# Patient Record
Sex: Female | Born: 1965 | Race: Black or African American | Hispanic: No | Marital: Married | Smoking: Never smoker
Health system: Southern US, Community
[De-identification: ages and names within clinical notes are randomized; demographics above are authoritative.]

## PROBLEM LIST (undated history)

## (undated) DIAGNOSIS — E119 Type 2 diabetes mellitus without complications: Secondary | ICD-10-CM

## (undated) DIAGNOSIS — I10 Essential (primary) hypertension: Secondary | ICD-10-CM

## (undated) HISTORY — PX: ABDOMINAL HYSTERECTOMY: SHX81

---

## 2010-02-22 ENCOUNTER — Ambulatory Visit: Payer: Self-pay | Admitting: Unknown Physician Specialty

## 2010-02-28 ENCOUNTER — Inpatient Hospital Stay: Payer: Self-pay

## 2010-07-25 ENCOUNTER — Ambulatory Visit: Payer: Self-pay | Admitting: Unknown Physician Specialty

## 2016-07-14 ENCOUNTER — Ambulatory Visit: Payer: 59 | Admitting: Family Medicine

## 2016-07-14 ENCOUNTER — Encounter: Payer: Self-pay | Admitting: Family Medicine

## 2016-07-14 ENCOUNTER — Ambulatory Visit (INDEPENDENT_AMBULATORY_CARE_PROVIDER_SITE_OTHER): Payer: 59 | Admitting: Family Medicine

## 2016-07-14 VITALS — BP 128/80 | HR 78 | Ht 65.0 in | Wt 172.0 lb

## 2016-07-14 DIAGNOSIS — R7302 Impaired glucose tolerance (oral): Secondary | ICD-10-CM

## 2016-07-14 DIAGNOSIS — R030 Elevated blood-pressure reading, without diagnosis of hypertension: Secondary | ICD-10-CM | POA: Diagnosis not present

## 2016-07-14 DIAGNOSIS — Z7189 Other specified counseling: Secondary | ICD-10-CM | POA: Diagnosis not present

## 2016-07-14 DIAGNOSIS — R5381 Other malaise: Secondary | ICD-10-CM

## 2016-07-14 DIAGNOSIS — R5383 Other fatigue: Secondary | ICD-10-CM

## 2016-07-14 DIAGNOSIS — Z7689 Persons encountering health services in other specified circumstances: Secondary | ICD-10-CM

## 2016-07-14 NOTE — Patient Instructions (Signed)
High Blood Pressure: Low-Sodium Diet   Many people find that cutting down on sodium lowers their blood pressure. A low-sodium diet limits the amount of sodium in your diet to no more than 2300 milligrams a day. One teaspoon of salt has about 2300 milligrams of sodium.   Our taste for salt is mainly a habit. When you gradually lower the amount of salt in your diet, your taste begins to change. After a while, food begins to taste better without salt than it did with it.   Dietary Recommendations   Table salt added to foods is a common source of sodium in the diet. By not adding salt to foods, you can reduce the amount of sodium in your diet. But sodium is also found in canned and prepared foods, even if they don't taste salty. Learn which foods to avoid by reading labels to find out how much sodium is in the foods. You can reduce the amount of sodium in your diet by following these guidelines:   Read labels carefully. Look for any form of sodium or salt, such as sodium benzoate or sodium citrate. Choose foods that have less salt.  Add very little or no salt to food that you prepare.  Check the sodium content when you use baking powder, baking soda, and monosodium glutamate (MSG).  Do not add salt to food at the table.  Fast foods are very high in salt, as are many other restaurant foods. When you eat at a restaurant, try steamed fish and vegetables or fresh salads. Avoid soups.  Avoid eating the following foods:  ketchup, prepared mustard, pickles, and olives  soy sauce, steak or barbecue sauce, chili sauce, or Worcestershire sauce  bouillon cubes  commercially prepared or cured meats or fish (for example, bacon, luncheon meats, and canned sardines)  canned vegetables, soups, and other packaged convenience foods  salty cheeses and buttermilk  salted nuts and peanut butter  self-rising flour and biscuit mixes  salted crackers, chips, popcorn, and pretzels  commercial salad dressings  instant  cooked cereals.    Many of these foods are now available in unsalted or low-sodium versions. Read all labels carefully.  If your diet must be restricted to much lower amounts of sodium, talk to your health care provider and a registered dietitian for help in planning your meals. It is important to keep your meals nutritionally balanced and tasty. It can be hard to follow a restricted-salt diet if the food doesn't taste good, but there are many healthy ways to add taste without adding salt or fat.   Use of Salt Substitutes   Ask your health care provider about using salt substitutes. Most salt substitutes contain potassium for flavor. If you are taking certain medications, you may need to be careful about the amount of potassium in your diet.        Substitutions and Hints   Season foods with herbs and spices. Use onions, garlic, parsley, lemon and lime juice and rind, dill weed, basil, tarragon, marjoram, thyme, curry powder, turmeric, cumin, paprika, vinegar, or wine to enhance the flavor and aroma of foods. Mushrooms, celery, red pepper, yellow pepper, green pepper, and dried fruits also enhance specific dishes.  Eat fresh foods (instead of canned or packaged foods) as much as possible. Also, plain frozen fruits and vegetables usually do not have added salt.  Add a pinch of sugar or a squeeze of lemon juice to bring out the flavor in fresh vegetables.  If you must   use canned products, use the low-sodium types (except for fruit). Rinse canned vegetables with tap water before cooking.  Substitute unsalted, polyunsaturated margarine for regular margarine or butter.  Eat low-sodium cheeses. Many are available now, some with herbs and spices that are very tasty, and many are also low-fat.  Drink low-sodium juices.  Make unsalted or lightly salted soup stocks and keep them in the freezer to use as substitutes for canned broth and bouillon. Use these stocks to enhance vegetables.  Substitute  wines and vinegars (especially the flavored vinegars) for salt to enhance flavors.  Eat tuna and salmon and rinse first with running water.  Use herbs such as bay leaf, curry, turmeric, cumin, cilantro, dill, marjoram, paprika, pepper, tarragon, thyme, sage, onions, or garlic to season chicken, beef, or fish.  Cook rice in homemade broth with mushrooms and scallions or shallots.  Help Yourself Become Healthier   Check food labels for sodium and fat content.  Read nutrition information available at your [employer], from the [employer], and through nutrition programs and health fairs. Ask your health care provider for printed information on nutrition, diet, and health.  Contact a dietitian for information.  Look for some of the excellent low-sodium cookbooks available in most bookstores.  Take time to plan and enjoy your meals. You will be pleasantly surprised at how fast you learn new food preparations, how lowering your sodium intake lowers your blood pressure, and how good food can be. Diabetes Mellitus and Food It is important for you to manage your blood sugar (glucose) level. Your blood glucose level can be greatly affected by what you eat. Eating healthier foods in the appropriate amounts throughout the day at about the same time each day will help you control your blood glucose level. It can also help slow or prevent worsening of your diabetes mellitus. Healthy eating may even help you improve the level of your blood pressure and reach or maintain a healthy weight.  General recommendations for healthful eating and cooking habits include:  Eating meals and snacks regularly. Avoid going long periods of time without eating to lose weight.  Eating a diet that consists mainly of plant-based foods, such as fruits, vegetables, nuts, legumes, and whole grains.  Using low-heat cooking methods, such as baking, instead of high-heat cooking methods, such as deep frying. Work with  your dietitian to make sure you understand how to use the Nutrition Facts information on food labels. HOW CAN FOOD AFFECT ME? Carbohydrates Carbohydrates affect your blood glucose level more than any other type of food. Your dietitian will help you determine how many carbohydrates to eat at each meal and teach you how to count carbohydrates. Counting carbohydrates is important to keep your blood glucose at a healthy level, especially if you are using insulin or taking certain medicines for diabetes mellitus. Alcohol Alcohol can cause sudden decreases in blood glucose (hypoglycemia), especially if you use insulin or take certain medicines for diabetes mellitus. Hypoglycemia can be a life-threatening condition. Symptoms of hypoglycemia (sleepiness, dizziness, and disorientation) are similar to symptoms of having too much alcohol.  If your health care provider has given you approval to drink alcohol, do so in moderation and use the following guidelines:  Women should not have more than one drink per day, and men should not have more than two drinks per day. One drink is equal to:  12 oz of beer.  5 oz of wine.  1 oz of hard liquor.  Do not drink  on an empty stomach.  Keep yourself hydrated. Have water, diet soda, or unsweetened iced tea.  Regular soda, juice, and other mixers might contain a lot of carbohydrates and should be counted. WHAT FOODS ARE NOT RECOMMENDED? As you make food choices, it is important to remember that all foods are not the same. Some foods have fewer nutrients per serving than other foods, even though they might have the same number of calories or carbohydrates. It is difficult to get your body what it needs when you eat foods with fewer nutrients. Examples of foods that you should avoid that are high in calories and carbohydrates but low in nutrients include:  Trans fats (most processed foods list trans fats on the Nutrition Facts label).  Regular  soda.  Juice.  Candy.  Sweets, such as cake, pie, doughnuts, and cookies.  Fried foods. WHAT FOODS CAN I EAT? Eat nutrient-rich foods, which will nourish your body and keep you healthy. The food you should eat also will depend on several factors, including:  The calories you need.  The medicines you take.  Your weight.  Your blood glucose level.  Your blood pressure level.  Your cholesterol level. You should eat a variety of foods, including:  Protein.  Lean cuts of meat.  Proteins low in saturated fats, such as fish, egg whites, and beans. Avoid processed meats.  Fruits and vegetables.  Fruits and vegetables that may help control blood glucose levels, such as apples, mangoes, and yams.  Dairy products.  Choose fat-free or low-fat dairy products, such as milk, yogurt, and cheese.  Grains, bread, pasta, and rice.  Choose whole grain products, such as multigrain bread, whole oats, and brown rice. These foods may help control blood pressure.  Fats.  Foods containing healthful fats, such as nuts, avocado, olive oil, canola oil, and fish. DOES EVERYONE WITH DIABETES MELLITUS HAVE THE SAME MEAL PLAN? Because every person with diabetes mellitus is different, there is not one meal plan that works for everyone. It is very important that you meet with a dietitian who will help you create a meal plan that is just right for you.   This information is not intended to replace advice given to you by your health care provider. Make sure you discuss any questions you have with your health care provider.   Document Released: 07/31/2005 Document Revised: 11/24/2014 Document Reviewed: 09/30/2013 Elsevier Interactive Patient Education [employer]2016 [employer] Inc.

## 2016-07-14 NOTE — Progress Notes (Signed)
Name: Pamela Solomon   MRN: [id number]    DOB: 1965-12-03   Date:07/14/2016       Progress Note  Subjective  Chief Complaint  Chief Complaint  Patient presents with  . Establish Care    just hasn't had a PCP  . Follow-up    had borderline A1C and B/P reading was elevated    Patient presents to establish care. Elevation of A1c 7.2   Thyroid Problem  Presents for initial visit. Symptoms include cold intolerance, dry skin and fatigue. Patient reports no anxiety, constipation, depressed mood, diaphoresis, diarrhea, hair loss, heat intolerance, hoarse voice, leg swelling, menstrual problem, nail problem, palpitations, tremors, visual change, weight gain or weight loss. The symptoms have been stable. Past treatments include nothing. Her past medical history is significant for diabetes. There is no history of atrial fibrillation, dementia, Graves' ophthalmopathy, heart failure, hyperlipidemia or neuropathy. There are no known risk factors.  Diabetes  She presents for her follow-up diabetic visit. She has type 2 diabetes mellitus. Hypoglycemia symptoms include headaches. Pertinent negatives for hypoglycemia include no confusion, dizziness, nervousness/anxiousness, sleepiness or tremors. Associated symptoms include fatigue. Pertinent negatives for diabetes include no blurred vision, no chest pain, no foot paresthesias, no foot ulcerations, no polydipsia, no polyphagia, no polyuria, no visual change, no weakness and no weight loss. Symptoms are improving. When asked about current treatments, none were reported. She is compliant with treatment most of the time.    No problem-specific Assessment & Plan notes found for this encounter.   History reviewed. No pertinent past medical history.  Past Surgical History:  Procedure Laterality Date  . ABDOMINAL HYSTERECTOMY      Family History  Problem Relation Age of Onset  . Diabetes Father   . Diabetes Sister   . Cancer Maternal Grandfather      Social History   Social History  . Marital status: Married    Spouse name: N/A  . Number of children: N/A  . Years of education: N/A   Occupational History  . Not on file.   Social History Main Topics  . Smoking status: Never Smoker  . Smokeless tobacco: Never Used  . Alcohol use No  . Drug use: No  . Sexual activity: Yes   Other Topics Concern  . Not on file   Social History Narrative  . No narrative on file    No Known Allergies   Review of Systems  Constitutional: Positive for fatigue. Negative for chills, diaphoresis, fever, malaise/fatigue, weight gain and weight loss.  HENT: Negative for ear discharge, ear pain, hoarse voice and sore throat.   Eyes: Negative for blurred vision.  Respiratory: Negative for cough, sputum production, shortness of breath and wheezing.   Cardiovascular: Negative for chest pain, palpitations and leg swelling.  Gastrointestinal: Negative for abdominal pain, blood in stool, constipation, diarrhea, heartburn, melena and nausea.  Genitourinary: Negative for dysuria, frequency, hematuria, menstrual problem and urgency.  Musculoskeletal: Negative for back pain, joint pain, myalgias and neck pain.  Skin: Positive for itching. Negative for rash.  Neurological: Positive for headaches. Negative for dizziness, tingling, tremors, sensory change, focal weakness and weakness.  Endo/Heme/Allergies: Positive for cold intolerance. Negative for environmental allergies, heat intolerance, polydipsia and polyphagia. Does not bruise/bleed easily.  Psychiatric/Behavioral: Negative for confusion, depression and suicidal ideas. The patient is not nervous/anxious and does not have insomnia.      Objective  Vitals:   07/14/16 1419  BP: 128/80  Pulse: 78  Weight: 172 lb (78 kg)  Height: 5\' 5"  (1.651 m)    Physical Exam  Constitutional: She is well-developed, well-nourished, and in no distress. No distress.  HENT:  Head: Normocephalic and atraumatic.   Right Ear: External ear normal.  Left Ear: External ear normal.  Nose: Nose normal.  Mouth/Throat: Oropharynx is clear and moist.  Eyes: Conjunctivae and EOM are normal. Pupils are equal, round, and reactive to light. Right eye exhibits no discharge. Left eye exhibits no discharge.  Neck: Normal range of motion. Neck supple. No JVD present. No thyromegaly present.  Cardiovascular: Normal rate, regular rhythm, normal heart sounds and intact distal pulses.  Exam reveals no gallop and no friction rub.   No murmur heard. Pulmonary/Chest: Effort normal and breath sounds normal. No respiratory distress. She has no wheezes. She has no rales.  Abdominal: Soft. Bowel sounds are normal. She exhibits no mass. There is no tenderness. There is no guarding.  Musculoskeletal: Normal range of motion. She exhibits no edema.  Lymphadenopathy:    She has no cervical adenopathy.  Neurological: She is alert.  Skin: Skin is warm and dry. She is not diaphoretic.  Psychiatric: Mood and affect normal.  Nursing note and vitals reviewed.     Assessment & Plan  Problem List Items Addressed This Visit    None    Visit Diagnoses    Encounter to establish care with new doctor    -  Primary   Elevated blood pressure (not hypertension)       decrease sdodium   Glucose intolerance (impaired glucose tolerance)       decrease intake starch/ repeat a1c in 4 wks   Malaise and fatigue       tsh in 4wks        Dr. Hayden [facility]  07/14/16

## 2017-07-27 ENCOUNTER — Ambulatory Visit (INDEPENDENT_AMBULATORY_CARE_PROVIDER_SITE_OTHER): Payer: 59 | Admitting: Family Medicine

## 2017-07-27 ENCOUNTER — Encounter: Payer: Self-pay | Admitting: Family Medicine

## 2017-07-27 VITALS — BP 118/82 | HR 78 | Ht 65.0 in | Wt 171.0 lb

## 2017-07-27 DIAGNOSIS — R739 Hyperglycemia, unspecified: Secondary | ICD-10-CM | POA: Diagnosis not present

## 2017-07-27 DIAGNOSIS — R5383 Other fatigue: Secondary | ICD-10-CM

## 2017-07-27 DIAGNOSIS — R635 Abnormal weight gain: Secondary | ICD-10-CM | POA: Diagnosis not present

## 2017-07-27 DIAGNOSIS — R5381 Other malaise: Secondary | ICD-10-CM

## 2017-07-27 NOTE — Progress Notes (Signed)
Name: Pamela Solomon   MRN: [id number]    DOB: 1966/07/08   Date:07/27/2017       Progress Note  Subjective  Chief Complaint  Chief Complaint  Patient presents with  . Fatigue    been noticing the fatigue for about a year- "I use to like going to the gym, but no longer want to"- thyroid and diab runs in family    Thyroid Problem  Presents for initial visit. Symptoms include cold intolerance, dry skin, fatigue, heat intolerance and weight gain. Patient reports no anxiety, constipation, diaphoresis, diarrhea, hair loss, hoarse voice, leg swelling, menstrual problem, nail problem, palpitations, tremors, visual change or weight loss. The symptoms have been worsening. Past treatments include nothing. Her past medical history is significant for diabetes. There is no history of heart failure or obesity. (Glucose elevation/ FH thy/diabetes)  Depression         This is a new problem.  Associated symptoms include fatigue and decreased interest.  Associated symptoms include no decreased concentration, no helplessness, no hopelessness, does not have insomnia, not irritable, no appetite change, no myalgias, no headaches, not sad and no suicidal ideas.  Past medical history includes thyroid problem.    (glucose elevation/ FH thy/diabetes)   No problem-specific Assessment & Plan notes found for this encounter.   No past medical history on file.  Past Surgical History:  Procedure Laterality Date  . ABDOMINAL HYSTERECTOMY      Family History  Problem Relation Age of Onset  . Diabetes Father   . Diabetes Sister   . Cancer Maternal Grandfather     Social History   Social History  . Marital status: Married    Spouse name: N/A  . Number of children: N/A  . Years of education: N/A   Occupational History  . Not on file.   Social History Main Topics  . Smoking status: Never Smoker  . Smokeless tobacco: Never Used  . Alcohol use No  . Drug use: No  . Sexual activity: Yes   Other Topics  Concern  . Not on file   Social History Narrative  . No narrative on file    No Known Allergies  No outpatient prescriptions prior to visit.   No facility-administered medications prior to visit.     Review of Systems  Constitutional: Positive for fatigue and weight gain. Negative for appetite change, chills, diaphoresis, fever, malaise/fatigue and weight loss.  HENT: Negative for ear discharge, ear pain, hoarse voice and sore throat.   Eyes: Negative for blurred vision.  Respiratory: Negative for cough, sputum production, shortness of breath and wheezing.   Cardiovascular: Negative for chest pain, palpitations and leg swelling.  Gastrointestinal: Negative for abdominal pain, blood in stool, constipation, diarrhea, heartburn, melena and nausea.  Genitourinary: Negative for dysuria, frequency, hematuria, menstrual problem and urgency.  Musculoskeletal: Negative for back pain, joint pain, myalgias and neck pain.  Skin: Negative for rash.  Neurological: Negative for dizziness, tingling, tremors, sensory change, focal weakness and headaches.  Endo/Heme/Allergies: Positive for cold intolerance and heat intolerance. Negative for environmental allergies and polydipsia. Does not bruise/bleed easily.  Psychiatric/Behavioral: Negative for decreased concentration, depression and suicidal ideas. The patient is not nervous/anxious and does not have insomnia.      Objective  Vitals:   07/27/17 0910  BP: 118/82  Pulse: 78  Weight: 171 lb (77.6 kg)  Height: 5\' 5"  (1.651 m)    Physical Exam  Constitutional: She is well-developed, well-nourished, and in no distress. She  is not irritable. No distress.  HENT:  Head: Normocephalic and atraumatic.  Right Ear: External ear normal.  Left Ear: External ear normal.  Nose: Nose normal.  Mouth/Throat: Oropharynx is clear and moist.  Eyes: Pupils are equal, round, and reactive to light. Conjunctivae and EOM are normal. Right eye exhibits no  discharge. Left eye exhibits no discharge.  Neck: Normal range of motion. Neck supple. Normal carotid pulses, no hepatojugular reflux and no JVD present. Carotid bruit is not present. No thyroid mass and no thyromegaly present.  Cardiovascular: Normal rate, regular rhythm, normal heart sounds and intact distal pulses.  Exam reveals no gallop and no friction rub.   No murmur heard. Pulmonary/Chest: Effort normal and breath sounds normal. She has no wheezes. She has no rales.  Abdominal: Soft. Bowel sounds are normal. She exhibits no mass. There is no tenderness. There is no guarding.  Musculoskeletal: Normal range of motion. She exhibits no edema.  Lymphadenopathy:    She has no cervical adenopathy.  Neurological: She is alert. She has normal reflexes.  Skin: Skin is warm and dry. No rash noted. She is not diaphoretic. No erythema. No pallor.  Psychiatric: Mood and affect normal.  Nursing note and vitals reviewed.     Assessment & Plan  Problem List Items Addressed This Visit    None    Visit Diagnoses    Malaise and fatigue    -  Primary   Relevant Orders   Renal Function Panel   TSH   Sedimentation rate   CBC with Differential/Platelet   Weight gain       Relevant Orders   Renal Function Panel   TSH   Sedimentation rate   CBC with Differential/Platelet   Hyperglycemia       Relevant Orders   Renal Function Panel   TSH   Sedimentation rate   CBC with Differential/Platelet      No orders of the defined types were placed in this encounter.     Dr. [facility]  07/27/17

## 2017-07-28 LAB — SEDIMENTATION RATE: Sed Rate: 16 mm/hr (ref 0–40)

## 2017-07-28 LAB — RENAL FUNCTION PANEL
ALBUMIN: 4.3 g/dL (ref 3.5–5.5)
BUN/Creatinine Ratio: 19 (ref 9–23)
BUN: 20 mg/dL (ref 6–24)
CHLORIDE: 104 mmol/L (ref 96–106)
CO2: 23 mmol/L (ref 20–29)
Calcium: 9.8 mg/dL (ref 8.7–10.2)
Creatinine, Ser: 1.08 mg/dL — ABNORMAL HIGH (ref 0.57–1.00)
GFR calc non Af Amer: 60 mL/min/{1.73_m2} (ref >59)
GFR, EST AFRICAN AMERICAN: 69 mL/min/{1.73_m2} (ref >59)
GLUCOSE: 95 mg/dL (ref 65–99)
POTASSIUM: 4.5 mmol/L (ref 3.5–5.2)
Phosphorus: 3.9 mg/dL (ref 2.5–4.5)
SODIUM: 142 mmol/L (ref 134–144)

## 2017-07-28 LAB — CBC WITH DIFFERENTIAL/PLATELET
BASOS: 0 %
Basophils Absolute: 0 10*3/uL (ref 0.0–0.2)
EOS (ABSOLUTE): 0.1 10*3/uL (ref 0.0–0.4)
Eos: 1 %
HEMATOCRIT: 42.4 % (ref 34.0–46.6)
HEMOGLOBIN: 14 g/dL (ref 11.1–15.9)
Immature Grans (Abs): 0.1 10*3/uL (ref 0.0–0.1)
Immature Granulocytes: 1 %
LYMPHS ABS: 3.6 10*3/uL — AB (ref 0.7–3.1)
Lymphs: 39 %
MCH: 26.4 pg — AB (ref 26.6–33.0)
MCHC: 33 g/dL (ref 31.5–35.7)
MCV: 80 fL (ref 79–97)
MONOCYTES: 5 %
Monocytes Absolute: 0.5 10*3/uL (ref 0.1–0.9)
NEUTROS ABS: 4.9 10*3/uL (ref 1.4–7.0)
Neutrophils: 54 %
Platelets: 268 10*3/uL (ref 150–379)
RBC: 5.3 x10E6/uL — AB (ref 3.77–5.28)
RDW: 14.8 % (ref 12.3–15.4)
WBC: 9.1 10*3/uL (ref 3.4–10.8)

## 2017-07-28 LAB — TSH: TSH: 3.5 u[IU]/mL (ref 0.450–4.500)

## 2017-08-28 ENCOUNTER — Ambulatory Visit (INDEPENDENT_AMBULATORY_CARE_PROVIDER_SITE_OTHER): Payer: 59 | Admitting: Family Medicine

## 2017-08-28 ENCOUNTER — Encounter: Payer: Self-pay | Admitting: Family Medicine

## 2017-08-28 VITALS — BP 120/88 | HR 80 | Ht 62.0 in | Wt 171.0 lb

## 2017-08-28 DIAGNOSIS — Z1239 Encounter for other screening for malignant neoplasm of breast: Secondary | ICD-10-CM

## 2017-08-28 DIAGNOSIS — E669 Obesity, unspecified: Secondary | ICD-10-CM

## 2017-08-28 DIAGNOSIS — Z23 Encounter for immunization: Secondary | ICD-10-CM | POA: Diagnosis not present

## 2017-08-28 DIAGNOSIS — Z1272 Encounter for screening for malignant neoplasm of vagina: Secondary | ICD-10-CM

## 2017-08-28 DIAGNOSIS — Z01419 Encounter for gynecological examination (general) (routine) without abnormal findings: Secondary | ICD-10-CM | POA: Diagnosis not present

## 2017-08-28 DIAGNOSIS — Z0000 Encounter for general adult medical examination without abnormal findings: Secondary | ICD-10-CM

## 2017-08-28 DIAGNOSIS — Z1211 Encounter for screening for malignant neoplasm of colon: Secondary | ICD-10-CM | POA: Diagnosis not present

## 2017-08-28 DIAGNOSIS — Z1231 Encounter for screening mammogram for malignant neoplasm of breast: Secondary | ICD-10-CM

## 2017-08-28 LAB — POCT URINALYSIS DIPSTICK
BILIRUBIN UA: NEGATIVE
GLUCOSE UA: NEGATIVE
KETONES UA: NEGATIVE
Leukocytes, UA: NEGATIVE
NITRITE UA: NEGATIVE
PH UA: 6 (ref 5.0–8.0)
Protein, UA: NEGATIVE
RBC UA: NEGATIVE
Spec Grav, UA: 1.015 (ref 1.010–1.025)
Urobilinogen, UA: 0.2 E.U./dL

## 2017-08-28 LAB — HEMOCCULT GUIAC POC 1CARD (OFFICE): Fecal Occult Blood, POC: NEGATIVE

## 2017-08-28 NOTE — Progress Notes (Signed)
Name: Pamela Solomon   MRN: [id number]    DOB: Mar 30, 1966   Date:08/28/2017       Progress Note  Subjective  Chief Complaint  Chief Complaint  Patient presents with  . Annual Exam    need mammo, colonoscopy, pap and flu vacc    Patient presents for annual physical exam.    No problem-specific Assessment & Plan notes found for this encounter.   No past medical history on file.  Past Surgical History:  Procedure Laterality Date  . ABDOMINAL HYSTERECTOMY      Family History  Problem Relation Age of Onset  . Diabetes Father   . Diabetes Sister   . Cancer Maternal Grandfather     Social History   Social History  . Marital status: Married    Spouse name: N/A  . Number of children: N/A  . Years of education: N/A   Occupational History  . Not on file.   Social History Main Topics  . Smoking status: Never Smoker  . Smokeless tobacco: Never Used  . Alcohol use No  . Drug use: No  . Sexual activity: Yes   Other Topics Concern  . Not on file   Social History Narrative  . No narrative on file    No Known Allergies  No outpatient prescriptions prior to visit.   No facility-administered medications prior to visit.     Review of Systems  Constitutional: Negative for chills, fever, malaise/fatigue and weight loss.  HENT: Negative for ear discharge, ear pain and sore throat.   Eyes: Negative for blurred vision.  Respiratory: Negative for cough, sputum production, shortness of breath and wheezing.   Cardiovascular: Negative for chest pain, palpitations and leg swelling.  Gastrointestinal: Negative for abdominal pain, blood in stool, constipation, diarrhea, heartburn, melena and nausea.  Genitourinary: Negative for dysuria, frequency, hematuria and urgency.  Musculoskeletal: Negative for back pain, joint pain, myalgias and neck pain.  Skin: Negative for rash.  Neurological: Negative for dizziness, tingling, sensory change, focal weakness and headaches.   Endo/Heme/Allergies: Negative for environmental allergies and polydipsia. Does not bruise/bleed easily.  Psychiatric/Behavioral: Negative for depression and suicidal ideas. The patient is not nervous/anxious and does not have insomnia.      Objective  Vitals:   08/28/17 0848  BP: 120/88  Pulse: 80  Weight: 171 lb (77.6 kg)  Height:  (1.575 m)    Physical Exam  Constitutional: She is oriented to person, place, and time and well-developed, well-nourished, and in no distress. No distress.  HENT:  Head: Normocephalic and atraumatic.  Right Ear: Hearing, tympanic membrane, external ear and ear canal normal.  Left Ear: Hearing, tympanic membrane, external ear and ear canal normal.  Nose: Nose normal.  Mouth/Throat: Uvula is midline and oropharynx is clear and moist. No oropharyngeal exudate, posterior oropharyngeal edema or posterior oropharyngeal erythema.  Eyes: Pupils are equal, round, and reactive to light. Conjunctivae, EOM and lids are normal. Right eye exhibits no discharge. Left eye exhibits no discharge.  Fundoscopic exam:      The right eye shows no arteriolar narrowing.       The left eye shows no arteriolar narrowing.  Neck: Trachea normal and normal range of motion. Neck supple. Normal carotid pulses, no hepatojugular reflux and no JVD present. Carotid bruit is not present. No thyroid mass and no thyromegaly present.  Cardiovascular: Normal rate, regular rhythm, S1 normal, S2 normal, normal heart sounds and intact distal pulses.  PMI is not displaced.  Exam  reveals no gallop, no S3, no S4 and no friction rub.   No murmur heard. Pulmonary/Chest: Effort normal and breath sounds normal. Right breast exhibits no inverted nipple, no mass, no nipple discharge, no skin change and no tenderness. Left breast exhibits no inverted nipple, no mass, no nipple discharge, no skin change and no tenderness. Breasts are symmetrical.  Abdominal: Soft. Bowel sounds are normal. She exhibits  no abdominal bruit and no mass. There is no hepatosplenomegaly. There is no tenderness. There is no guarding and no CVA tenderness.  Genitourinary: Rectum normal, vagina normal, right adnexa normal, left adnexa normal and vulva normal.  Genitourinary Comments: S/p hysterectomy  Musculoskeletal: Normal range of motion. She exhibits no edema.       Lumbar back: Normal.  Lymphadenopathy:       Head (right side): No submandibular adenopathy present.       Head (left side): No submandibular adenopathy present.    She has no cervical adenopathy.    She has no axillary adenopathy.  Neurological: She is alert and oriented to person, place, and time. She has normal sensation, normal strength, normal reflexes and intact cranial nerves.  Skin: Skin is warm and dry. She is not diaphoretic.  Psychiatric: Mood and affect normal.  Nursing note and vitals reviewed.     Assessment & Plan  Problem List Items Addressed This Visit    None    Visit Diagnoses    Annual physical exam    -  Primary   Relevant Orders   Lipid Profile   Renal Function Panel   POCT urinalysis dipstick (Completed)   Encounter for gynecological examination without abnormal finding       Influenza vaccine needed       Relevant Orders   Flu Vaccine QUAD 36+ mos IM (Completed)   Breast screening       Relevant Orders   MM Digital Screening   Colon cancer screening       Relevant Orders   Ambulatory referral to Gastroenterology   POCT occult blood stool (Completed)   Obesity (BMI 30.0-34.9)       Relevant Orders   Lipid Profile   Screening for vaginal cancer       Relevant Orders   Pap IG (Image Guided)      No orders of the defined types were placed in this encounter.     Dr. Hayden [facility]  08/28/17

## 2017-08-30 LAB — RENAL FUNCTION PANEL
Albumin: 4.5 g/dL (ref 3.5–5.5)
BUN/Creatinine Ratio: 16 (ref 9–23)
BUN: 17 mg/dL (ref 6–24)
CHLORIDE: 101 mmol/L (ref 96–106)
CO2: 25 mmol/L (ref 20–29)
Calcium: 9.9 mg/dL (ref 8.7–10.2)
Creatinine, Ser: 1.04 mg/dL — ABNORMAL HIGH (ref 0.57–1.00)
GFR calc Af Amer: 72 mL/min/{1.73_m2} (ref >59)
GFR, EST NON AFRICAN AMERICAN: 62 mL/min/{1.73_m2} (ref >59)
Glucose: 99 mg/dL (ref 65–99)
PHOSPHORUS: 4.1 mg/dL (ref 2.5–4.5)
POTASSIUM: 4.4 mmol/L (ref 3.5–5.2)
Sodium: 141 mmol/L (ref 134–144)

## 2017-08-30 LAB — PAP IG (IMAGE GUIDED): PAP SMEAR COMMENT: 0

## 2017-08-30 LAB — LIPID PANEL
CHOL/HDL RATIO: 2.7 ratio (ref 0.0–4.4)
CHOLESTEROL TOTAL: 204 mg/dL — AB (ref 100–199)
HDL: 76 mg/dL (ref >39)
LDL Calculated: 107 mg/dL — ABNORMAL HIGH (ref 0–99)
TRIGLYCERIDES: 107 mg/dL (ref 0–149)
VLDL Cholesterol Cal: 21 mg/dL (ref 5–40)

## 2017-08-30 LAB — SPECIMEN STATUS REPORT

## 2017-09-21 ENCOUNTER — Other Ambulatory Visit: Payer: Self-pay | Admitting: Family Medicine

## 2017-09-21 ENCOUNTER — Ambulatory Visit
Admission: RE | Admit: 2017-09-21 | Discharge: 2017-09-21 | Disposition: A | Discharge status: Home / Self Care | Payer: 59 | Source: Ambulatory Visit | Attending: Family Medicine | Admitting: Family Medicine

## 2017-09-21 DIAGNOSIS — R928 Other abnormal and inconclusive findings on diagnostic imaging of breast: Secondary | ICD-10-CM | POA: Insufficient documentation

## 2017-09-21 DIAGNOSIS — Z1239 Encounter for other screening for malignant neoplasm of breast: Secondary | ICD-10-CM

## 2017-09-21 DIAGNOSIS — Z1231 Encounter for screening mammogram for malignant neoplasm of breast: Secondary | ICD-10-CM | POA: Insufficient documentation

## 2017-09-21 DIAGNOSIS — N6310 Unspecified lump in the right breast, unspecified quadrant: Secondary | ICD-10-CM | POA: Diagnosis not present

## 2017-09-23 ENCOUNTER — Other Ambulatory Visit: Payer: Self-pay | Admitting: Family Medicine

## 2017-09-23 DIAGNOSIS — N6310 Unspecified lump in the right breast, unspecified quadrant: Secondary | ICD-10-CM

## 2017-09-23 DIAGNOSIS — R928 Other abnormal and inconclusive findings on diagnostic imaging of breast: Secondary | ICD-10-CM

## 2017-10-01 ENCOUNTER — Ambulatory Visit
Admission: RE | Admit: 2017-10-01 | Discharge: 2017-10-01 | Disposition: A | Discharge status: Home / Self Care | Payer: 59 | Source: Ambulatory Visit | Attending: Family Medicine | Admitting: Family Medicine

## 2017-10-01 DIAGNOSIS — N6310 Unspecified lump in the right breast, unspecified quadrant: Secondary | ICD-10-CM

## 2017-10-01 DIAGNOSIS — R928 Other abnormal and inconclusive findings on diagnostic imaging of breast: Secondary | ICD-10-CM | POA: Diagnosis not present

## 2017-12-18 ENCOUNTER — Encounter: Payer: Self-pay | Admitting: *Deleted

## 2018-05-19 IMAGING — MG MM DIGITAL DIAGNOSTIC UNILAT*R* W/ TOMO W/ CAD
6 series · 6 of 14 positions shown · non-contrast
Comparison: Mammography 09/21/2017, 07/25/2010.

CLINICAL DATA: Recall from screening mammography with
tomosynthesis, possible mass or focal asymmetry in the outer
subareolar right breast.

EXAM:
2D DIGITAL DIAGNOSTIC RIGHT MAMMOGRAM WITH ADJUNCT TOMO
ULTRASOUND RIGHT BREAST

[R MLO]
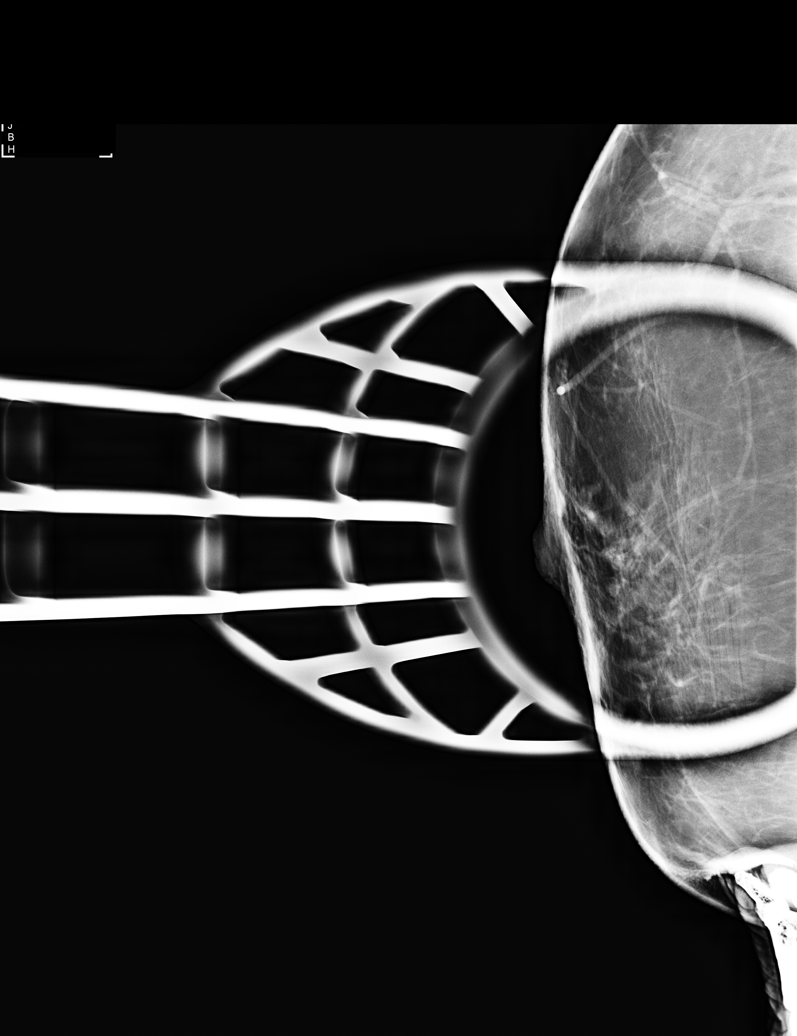

[R MLO synth-2D]
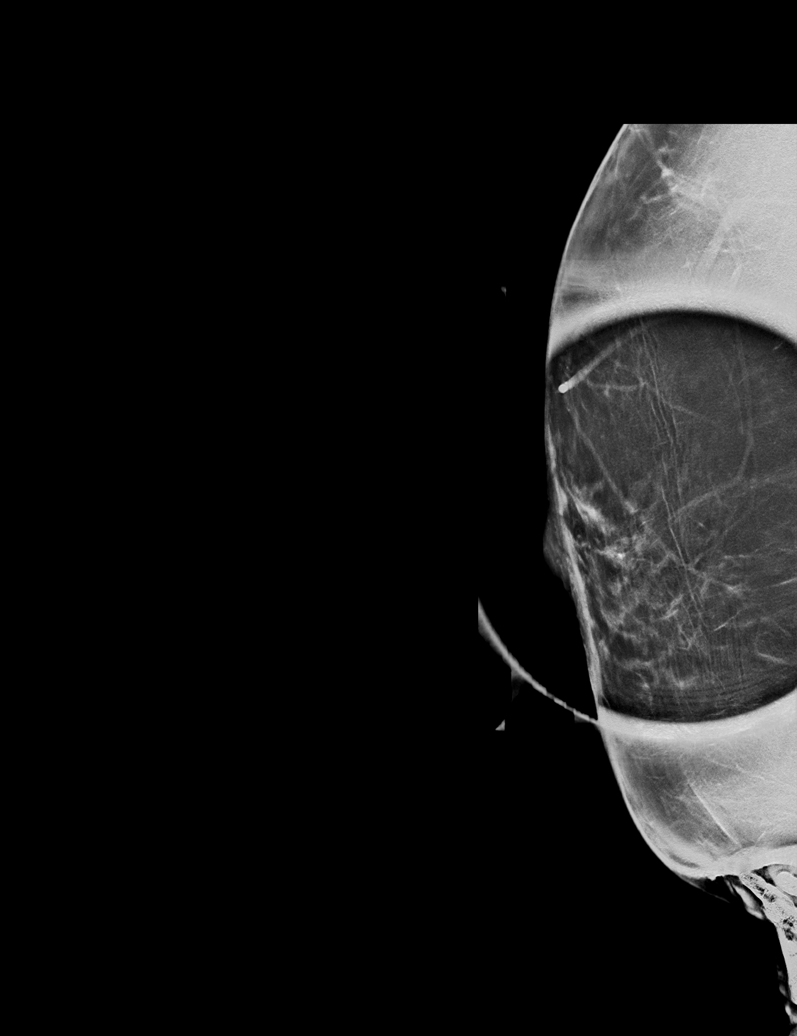

[R CC]
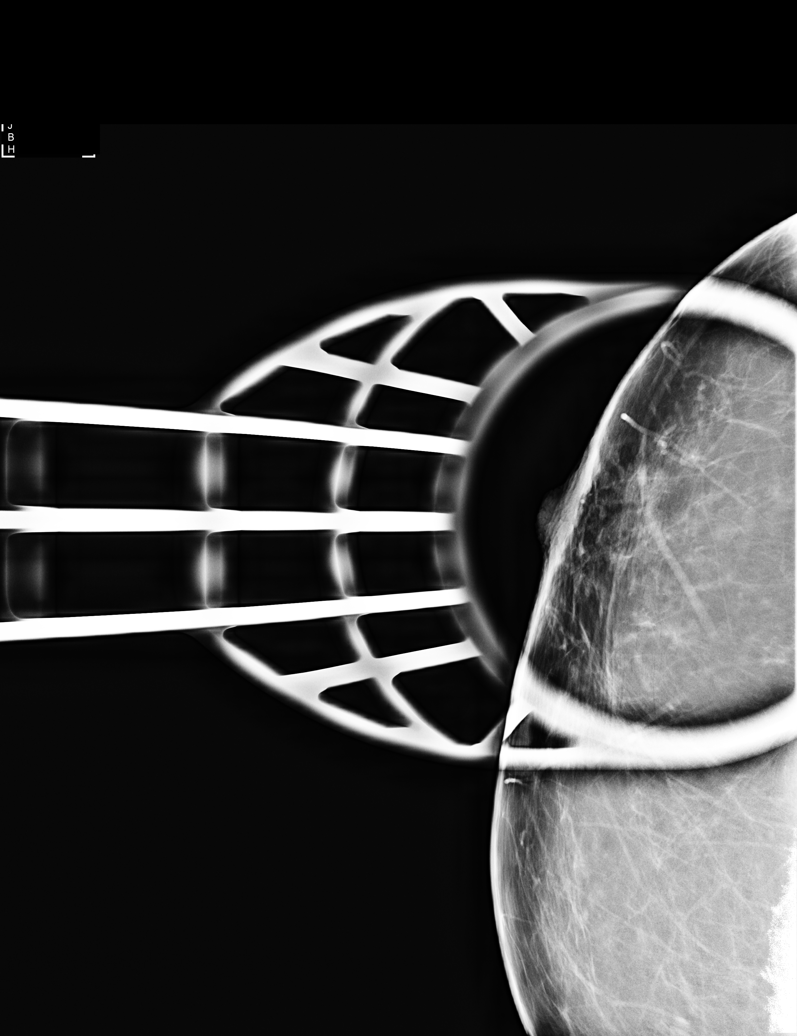

[R CC synth-2D]
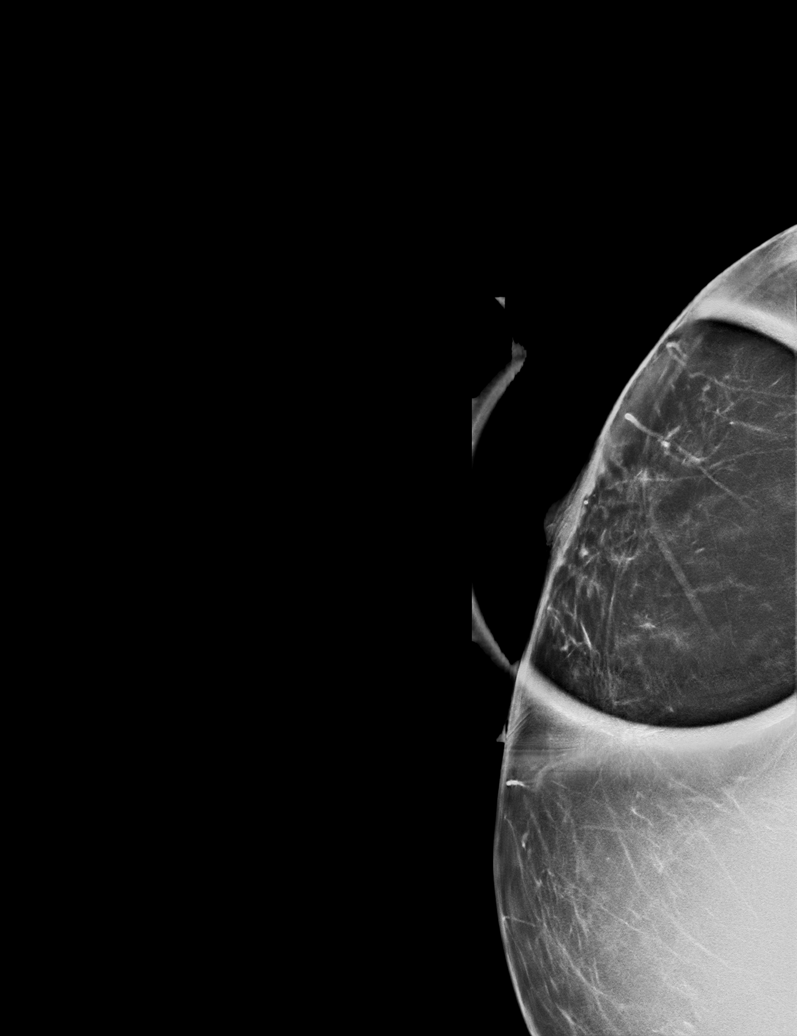

[R MLO tomo · tomo slice 33/66.0]
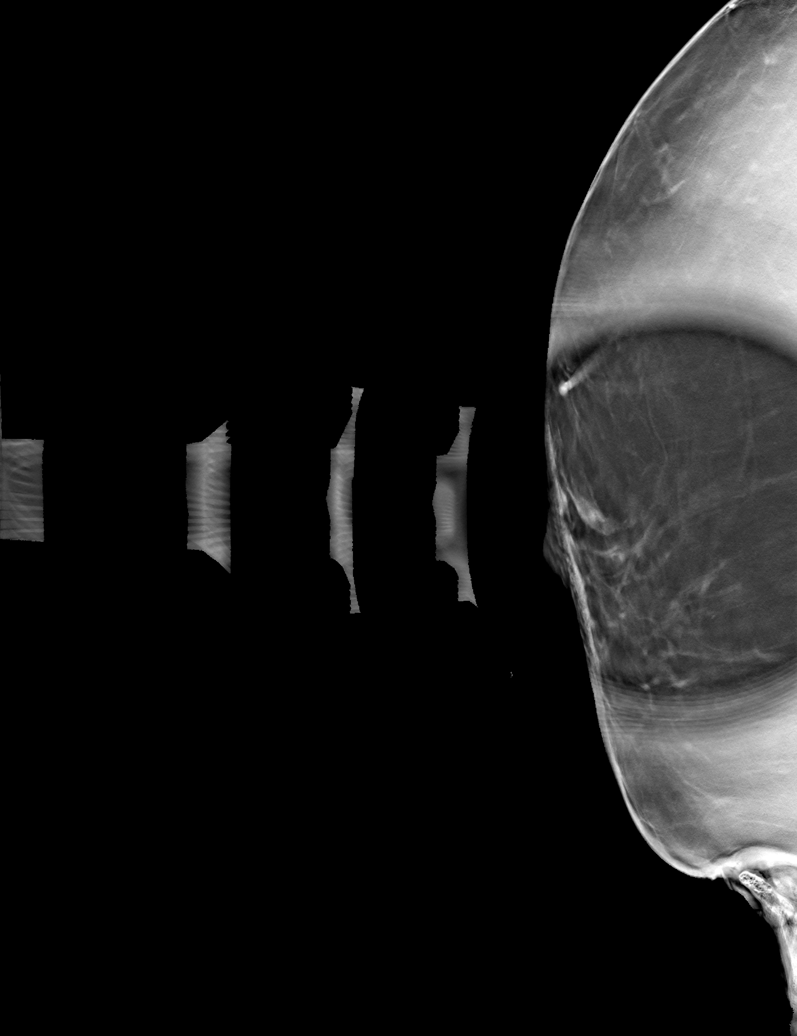

[R CC tomo · tomo slice 33/64.0]
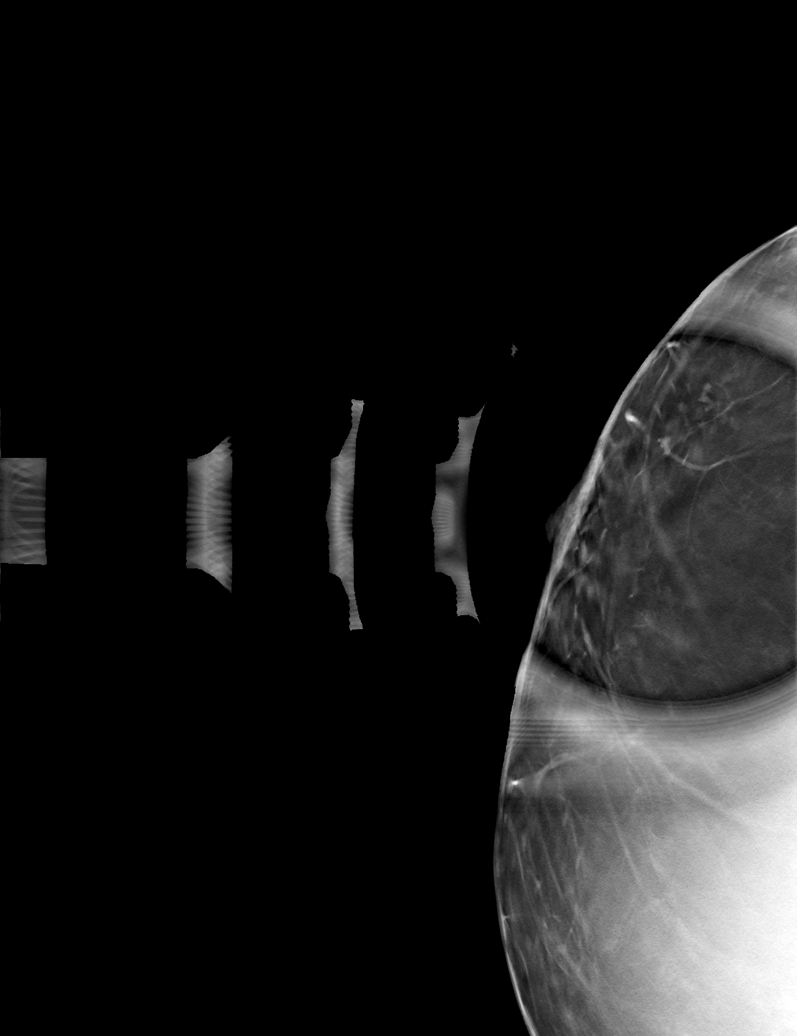

[6 of 14 positions shown; findings below may reference images not displayed]

No prior
ultrasound.

ACR Breast Density Category b: There are scattered areas of
fibroglandular density.
FINDINGS: Standard and tomosynthesis spot-compression CC and MLO views of the
area of concern in the right breast were obtained.

The focal asymmetry in the outer subareolar right breast disperses
with compression and there is no underlying mass or architectural
distortion.

On physical exam, there is no palpable abnormality in the subareolar
right breast.

Targeted right breast ultrasound is performed, showing normal
fibrofatty and scattered fibroglandular tissue in the subareolar
right breast. No cyst, solid mass, intraductal mass or abnormal
acoustic shadowing is identified.
IMPRESSION: No mammographic or sonographic evidence of malignancy involving the
right breast. The screening mammographic finding corresponds to an
island of normal fibroglandular tissue.

RECOMMENDATION:
Screening mammogram in one year.(Code:[id number]-[id number])

I have discussed the findings and recommendations with the patient.
Results were also provided in writing at the conclusion of the
visit. If applicable, a reminder letter will be sent to the patient
regarding the next appointment.

BI-RADS CATEGORY  1: Negative.

## 2018-12-15 ENCOUNTER — Ambulatory Visit: Payer: Managed Care, Other (non HMO) | Admitting: Family Medicine

## 2018-12-15 ENCOUNTER — Encounter: Payer: Self-pay | Admitting: Family Medicine

## 2018-12-15 VITALS — BP 124/100 | HR 80 | Ht 62.0 in | Wt 185.0 lb

## 2018-12-15 DIAGNOSIS — J0100 Acute maxillary sinusitis, unspecified: Secondary | ICD-10-CM | POA: Diagnosis not present

## 2018-12-15 MED ORDER — FLUCONAZOLE 150 MG PO TABS: 150.0000 mg | ORAL_TABLET | Freq: Once | ORAL | 0 refills | Status: AC

## 2018-12-15 MED ORDER — AZITHROMYCIN 250 MG PO TABS: ORAL_TABLET | ORAL | 0 refills | Status: DC

## 2018-12-15 MED ORDER — FLUTICASONE PROPIONATE 50 MCG/ACT NA SUSP: 2.0000 | Freq: Every day | NASAL | 6 refills | Status: DC

## 2018-12-15 NOTE — Progress Notes (Signed)
Date:  12/15/2018   Name:  Pamela Solomon   DOB:  06-Feb-1966   MRN:  [id number]   Chief Complaint: Sinusitis (frontal sinus pressure/ headaches. Warm compress eases the headache, has congestion with yellow production)  Sinusitis  This is a recurrent problem. The current episode started in the past 7 days. The problem has been waxing and waning since onset. There has been no fever. The pain is moderate. Associated symptoms include congestion, headaches, sinus pressure, sneezing and a sore throat. Pertinent negatives include no chills, coughing, diaphoresis, ear pain, hoarse voice, neck pain, shortness of breath or swollen glands. Past treatments include nothing. The treatment provided mild relief.    Review of Systems  Constitutional: Negative.  Negative for chills, diaphoresis, fatigue, fever and unexpected weight change.  HENT: Positive for congestion, sinus pressure, sneezing and sore throat. Negative for ear discharge, ear pain, hoarse voice and rhinorrhea.   Eyes: Negative for photophobia, pain, discharge, redness and itching.  Respiratory: Negative for cough, shortness of breath, wheezing and stridor.   Gastrointestinal: Negative for abdominal pain, blood in stool, constipation, diarrhea, nausea and vomiting.  Endocrine: Negative for cold intolerance, heat intolerance, polydipsia, polyphagia and polyuria.  Genitourinary: Negative for dysuria, flank pain, frequency, hematuria, menstrual problem, pelvic pain, urgency, vaginal bleeding and vaginal discharge.  Musculoskeletal: Negative for arthralgias, back pain, myalgias and neck pain.  Skin: Negative for rash.  Allergic/Immunologic: Negative for environmental allergies and food allergies.  Neurological: Positive for headaches. Negative for dizziness, weakness, light-headedness and numbness.  Hematological: Negative for adenopathy. Does not bruise/bleed easily.  Psychiatric/Behavioral: Negative for dysphoric mood. The patient is not  nervous/anxious.     There are no active problems to display for this patient.   No Known Allergies  Past Surgical History:  Procedure Laterality Date  . ABDOMINAL HYSTERECTOMY      Social History   Tobacco Use  . Smoking status: Never Smoker  . Smokeless tobacco: Never Used  Substance Use Topics  . Alcohol use: No  . Drug use: No     Medication list has been reviewed and updated.  No outpatient medications have been marked as taking for the 12/15/18 encounter (Office Visit) with Duanne Limerick, MD.    [facility] 2/9 Scores 08/28/2017 07/27/2017  PHQ - 2 Score 0 0  PHQ- 9 Score 1 3    Physical Exam Constitutional:      General: She is not in acute distress.    Appearance: She is not diaphoretic.  HENT:     Head: Normocephalic and atraumatic.     Right Ear: External ear normal.     Left Ear: External ear normal.     Nose: No congestion or rhinorrhea.  Eyes:     General:        Right eye: No discharge.        Left eye: No discharge.     Conjunctiva/sclera: Conjunctivae normal.     Pupils: Pupils are equal, round, and reactive to light.  Neck:     Musculoskeletal: Normal range of motion and neck supple.     Thyroid: No thyromegaly.     Vascular: No JVD.  Cardiovascular:     Rate and Rhythm: Normal rate and regular rhythm.     Heart sounds: Normal heart sounds. No murmur. No friction rub. No gallop.   Pulmonary:     Effort: Pulmonary effort is normal.     Breath sounds: Normal breath sounds.  Abdominal:  General: Bowel sounds are normal.     Palpations: Abdomen is soft. There is no mass.     Tenderness: There is no abdominal tenderness. There is no guarding.  Musculoskeletal: Normal range of motion.  Lymphadenopathy:     Cervical: No cervical adenopathy.  Skin:    General: Skin is warm and dry.  Neurological:     Mental Status: She is alert.     Deep Tendon Reflexes: Reflexes are normal and symmetric.     BP (!) 124/100   Pulse 80   Ht 5\' 2"  (1.575  m)   Wt 185 lb (83.9 kg)   BMI 33.84 kg/m   Assessment and Plan:  1. Subacute maxillary sinusitis Acute. Complained of headache and sinus pressure. Start ZPack and fluticasone nasal spray - azithromycin (ZITHROMAX) 250 MG tablet; 2 today then 1 a day for 4 days  Dispense: 6 tablet; Refill: 0 - fluticasone (FLONASE) 50 MCG/ACT nasal spray; Place 2 sprays into both nostrils daily.  Dispense: 16 g; Refill: 6

## 2020-02-02 ENCOUNTER — Ambulatory Visit: Payer: Self-pay

## 2020-02-11 ENCOUNTER — Ambulatory Visit: Payer: 59

## 2020-08-27 ENCOUNTER — Other Ambulatory Visit: Payer: Self-pay

## 2020-08-27 ENCOUNTER — Encounter: Payer: Self-pay | Admitting: Family Medicine

## 2020-08-27 ENCOUNTER — Ambulatory Visit: Payer: Managed Care, Other (non HMO) | Admitting: Family Medicine

## 2020-08-27 VITALS — BP 120/80 | HR 80 | Ht 62.0 in | Wt 185.0 lb

## 2020-08-27 DIAGNOSIS — Z008 Encounter for other general examination: Secondary | ICD-10-CM

## 2020-08-27 DIAGNOSIS — Z23 Encounter for immunization: Secondary | ICD-10-CM

## 2020-08-27 DIAGNOSIS — E669 Obesity, unspecified: Secondary | ICD-10-CM

## 2020-08-27 NOTE — Progress Notes (Addendum)
Date:  08/27/2020   Name:  Pamela Solomon   DOB:  1966-07-25   MRN:  [id number]   Chief Complaint: bmi form ([facility]) and Flu Vaccine  Patient is a 54 year old female who presents for a employment health maintenance exam. The patient reports the following problems: obesity. Health maintenance has been reviewed influenza.   Lab Results  Component Value Date   CREATININE 1.04 (H) 08/28/2017   BUN 17 08/28/2017   NA 141 08/28/2017   K 4.4 08/28/2017   CL 101 08/28/2017   CO2 25 08/28/2017   Lab Results  Component Value Date   CHOL 204 (H) 08/28/2017   HDL 76 08/28/2017   LDLCALC 107 (H) 08/28/2017   TRIG 107 08/28/2017   CHOLHDL 2.7 08/28/2017   Lab Results  Component Value Date   TSH 3.500 07/27/2017   No results found for: HGBA1C Lab Results  Component Value Date   WBC 9.1 07/27/2017   HGB 14.0 07/27/2017   HCT 42.4 07/27/2017   MCV 80 07/27/2017   PLT 268 07/27/2017   No results found for: ALT, AST, GGT, ALKPHOS, BILITOT   Review of Systems  Constitutional: Negative.  Negative for chills, fatigue, fever and unexpected weight change.  HENT: Negative for congestion, ear discharge, ear pain, rhinorrhea, sinus pressure, sneezing and sore throat.   Eyes: Negative for photophobia, pain, discharge, redness and itching.  Respiratory: Negative for cough, shortness of breath, wheezing and stridor.   Gastrointestinal: Negative for abdominal pain, blood in stool, constipation, diarrhea, nausea and vomiting.  Endocrine: Negative for cold intolerance, heat intolerance, polydipsia, polyphagia and polyuria.  Genitourinary: Negative for dysuria, flank pain, frequency, hematuria, menstrual problem, pelvic pain, urgency, vaginal bleeding and vaginal discharge.  Musculoskeletal: Negative for arthralgias, back pain and myalgias.  Skin: Negative for rash.  Allergic/Immunologic: Negative for environmental allergies and food allergies.  Neurological: Negative for dizziness,  weakness, light-headedness, numbness and headaches.  Hematological: Negative for adenopathy. Does not bruise/bleed easily.  Psychiatric/Behavioral: Negative for dysphoric mood. The patient is not nervous/anxious.     There are no problems to display for this patient.   No Known Allergies  Past Surgical History:  Procedure Laterality Date   ABDOMINAL HYSTERECTOMY      Social History   Tobacco Use   Smoking status: Never Smoker   Smokeless tobacco: Never Used  Substance Use Topics   Alcohol use: No   Drug use: No     Medication list has been reviewed and updated.  Current Meds  Medication Sig   fluticasone (FLONASE) 50 MCG/ACT nasal spray Place 2 sprays into both nostrils daily.    PHQ 2/9 Scores 08/27/2020 08/28/2017 07/27/2017  PHQ - 2 Score 0 0 0  PHQ- 9 Score 0 1 3    GAD 7 : Generalized Anxiety Score 08/27/2020  Nervous, Anxious, on Edge 0  Control/stop worrying 0  Worry too much - different things 0  Trouble relaxing 0  Restless 0  Easily annoyed or irritable 0  Afraid - awful might happen 0  Total GAD 7 Score 0    BP Readings from Last 3 Encounters:  08/27/20 120/80  12/15/18 (!) 124/100  08/28/17 120/88    Physical Exam Vitals and nursing note reviewed.  Constitutional:      General: She is not in acute distress.    Appearance: She is not diaphoretic.  HENT:     Head: Normocephalic and atraumatic.     Right Ear: Tympanic membrane, ear  canal and external ear normal.     Left Ear: Tympanic membrane, ear canal and external ear normal.     Nose: Nose normal.  Eyes:     General:        Right eye: No discharge.        Left eye: No discharge.     Conjunctiva/sclera: Conjunctivae normal.     Pupils: Pupils are equal, round, and reactive to light.  Neck:     Thyroid: No thyromegaly.     Vascular: No JVD.  Cardiovascular:     Rate and Rhythm: Normal rate and regular rhythm.     Heart sounds: Normal heart sounds. No murmur heard.  No  friction rub. No gallop.   Pulmonary:     Effort: Pulmonary effort is normal.     Breath sounds: Normal breath sounds.  Abdominal:     General: Bowel sounds are normal.     Palpations: Abdomen is soft. There is no mass.     Tenderness: There is no abdominal tenderness. There is no guarding.  Musculoskeletal:        General: Normal range of motion.     Cervical back: Normal range of motion and neck supple.  Lymphadenopathy:     Cervical: No cervical adenopathy.  Skin:    General: Skin is warm and dry.  Neurological:     Mental Status: She is alert.     Deep Tendon Reflexes: Reflexes are normal and symmetric.     Wt Readings from Last 3 Encounters:  08/27/20 185 lb (83.9 kg)  12/15/18 185 lb (83.9 kg)  08/28/17 171 lb (77.6 kg)    BP 120/80    Pulse 80    Ht 5\' 2"  (1.575 m)    Wt 185 lb (83.9 kg)    BMI 33.84 kg/m   Assessment and Plan: 1. Obesity (BMI 30.0-34.9) Chronic.  Uncontrolled.  Stable.  This was discussed in depth with the patient in terms of counting calories and have an awareness of portions as well as carbohydrate/protein/fat content.  We also discussed about exercise and will that it is an adjuvant at best for weight loss.  Patient is goal driven for 1200 to 1500 cal a day and will return for reevaluation.  2. Need for immunization against influenza Discussed and administered - Flu Vaccine QUAD 36+ mos IM  3. Biometric metric screening

## 2020-08-27 NOTE — Patient Instructions (Addendum)
Calorie Counting for Weight Loss Calories are units of energy. Your body needs a certain amount of calories from food to keep you going throughout the day. When you eat more calories than your body needs, your body stores the extra calories as fat. When you eat fewer calories than your body needs, your body burns fat to get the energy it needs. Calorie counting means keeping track of how many calories you eat and drink each day. Calorie counting can be helpful if you need to lose weight. If you make sure to eat fewer calories than your body needs, you should lose weight. Ask your health care provider what a healthy weight is for you. For calorie counting to work, you will need to eat the right number of calories in a day in order to lose a healthy amount of weight per week. A dietitian can help you determine how many calories you need in a day and will give you suggestions on how to reach your calorie goal.  A healthy amount of weight to lose per week is usually 1-2 lb (0.5-0.9 kg). This usually means that your daily calorie intake should be reduced by 500-750 calories.  Eating 1,200 - 1,500 calories per day can help most women lose weight.  Eating 1,500 - 1,800 calories per day can help most men lose weight. What is my plan? My goal is to have __________ calories per day. If I have this many calories per day, I should lose around __________ pounds per week. What do I need to know about calorie counting? In order to meet your daily calorie goal, you will need to:  Find out how many calories are in each food you would like to eat. Try to do this before you eat.  Decide how much of the food you plan to eat.  Write down what you ate and how many calories it had. Doing this is called keeping a food log. To successfully lose weight, it is important to balance calorie counting with a healthy lifestyle that includes regular activity. Aim for 150 minutes of moderate exercise (such as walking) or 75  minutes of vigorous exercise (such as running) each week. Where do I find calorie information?  The number of calories in a food can be found on a Nutrition Facts label. If a food does not have a Nutrition Facts label, try to look up the calories online or ask your dietitian for help. Remember that calories are listed per serving. If you choose to have more than one serving of a food, you will have to multiply the calories per serving by the amount of servings you plan to eat. For example, the label on a package of bread might say that a serving size is 1 slice and that there are 90 calories in a serving. If you eat 1 slice, you will have eaten 90 calories. If you eat 2 slices, you will have eaten 180 calories. How do I keep a food log? Immediately after each meal, record the following information in your food log:  What you ate. Don't forget to include toppings, sauces, and other extras on the food.  How much you ate. This can be measured in cups, ounces, or number of items.  How many calories each food and drink had.  The total number of calories in the meal. Keep your food log near you, such as in a small notebook in your pocket, or use a mobile app or website. Some programs will calculate   calories for you and show you how many calories you have left for the day to meet your goal. What are some calorie counting tips?   Use your calories on foods and drinks that will fill you up and not leave you hungry: ? Some examples of foods that fill you up are nuts and nut butters, vegetables, lean proteins, and high-fiber foods like whole grains. High-fiber foods are foods with more than 5 g fiber per serving. ? Drinks such as sodas, specialty coffee drinks, alcohol, and juices have a lot of calories, yet do not fill you up.  Eat nutritious foods and avoid empty calories. Empty calories are calories you get from foods or beverages that do not have many vitamins or protein, such as candy, sweets, and  soda. It is better to have a nutritious high-calorie food (such as an avocado) than a food with few nutrients (such as a bag of chips).  Know how many calories are in the foods you eat most often. This will help you calculate calorie counts faster.  Pay attention to calories in drinks. Low-calorie drinks include water and unsweetened drinks.  Pay attention to nutrition labels for "low fat" or "fat free" foods. These foods sometimes have the same amount of calories or more calories than the full fat versions. They also often have added sugar, starch, or salt, to make up for flavor that was removed with the fat.  Find a way of tracking calories that works for you. Get creative. Try different apps or programs if writing down calories does not work for you. What are some portion control tips?  Know how many calories are in a serving. This will help you know how many servings of a certain food you can have.  Use a measuring cup to measure serving sizes. You could also try weighing out portions on a kitchen scale. With time, you will be able to estimate serving sizes for some foods.  Take some time to put servings of different foods on your favorite plates, bowls, and cups so you know what a serving looks like.  Try not to eat straight from a bag or box. Doing this can lead to overeating. Put the amount you would like to eat in a cup or on a plate to make sure you are eating the right portion.  Use smaller plates, glasses, and bowls to prevent overeating.  Try not to multitask (for example, watch TV or use your computer) while eating. If it is time to eat, sit down at a table and enjoy your food. This will help you to know when you are full. It will also help you to be aware of what you are eating and how much you are eating. What are tips for following this plan? Reading food labels  Check the calorie count compared to the serving size. The serving size may be smaller than what you are used to  eating.  Check the source of the calories. Make sure the food you are eating is high in vitamins and protein and low in saturated and trans fats. Shopping  Read nutrition labels while you shop. This will help you make healthy decisions before you decide to purchase your food.  Make a grocery list and stick to it. Cooking  Try to cook your favorite foods in a healthier way. For example, try baking instead of frying.  Use low-fat dairy products. Meal planning  Use more fruits and vegetables. Half of your plate should be fruits   and vegetables.  Include lean proteins like poultry and fish. How do I count calories when eating out?  Ask for smaller portion sizes.  Consider sharing an entree and sides instead of getting your own entree.  If you get your own entree, eat only half. Ask for a box at the beginning of your meal and put the rest of your entree in it so you are not tempted to eat it.  If calories are listed on the menu, choose the lower calorie options.  Choose dishes that include vegetables, fruits, whole grains, low-fat dairy products, and lean protein.  Choose items that are boiled, broiled, grilled, or steamed. Stay away from items that are buttered, battered, fried, or served with cream sauce. Items labeled "crispy" are usually fried, unless stated otherwise.  Choose water, low-fat milk, unsweetened iced tea, or other drinks without added sugar. If you want an alcoholic beverage, choose a lower calorie option such as a glass of wine or light beer.  Ask for dressings, sauces, and syrups on the side. These are usually high in calories, so you should limit the amount you eat.  If you want a salad, choose a garden salad and ask for grilled meats. Avoid extra toppings like bacon, cheese, or fried items. Ask for the dressing on the side, or ask for olive oil and vinegar or lemon to use as dressing.  Estimate how many servings of a food you are given. For example, a serving of  cooked rice is  cup or about the size of half a baseball. Knowing serving sizes will help you be aware of how much food you are eating at restaurants. The list below tells you how big or small some common portion sizes are based on everyday objects: ? 1 oz--4 stacked dice. ? 3 oz--1 deck of cards. ? 1 tsp--1 die. ? 1 Tbsp-- a ping-pong ball. ? 2 Tbsp--1 ping-pong ball. ?  cup-- baseball. ? 1 cup--1 baseball. Summary  Calorie counting means keeping track of how many calories you eat and drink each day. If you eat fewer calories than your body needs, you should lose weight.  A healthy amount of weight to lose per week is usually 1-2 lb (0.5-0.9 kg). This usually means reducing your daily calorie intake by 500-750 calories.  The number of calories in a food can be found on a Nutrition Facts label. If a food does not have a Nutrition Facts label, try to look up the calories online or ask your dietitian for help.  Use your calories on foods and drinks that will fill you up, and not on foods and drinks that will leave you hungry.  Use smaller plates, glasses, and bowls to prevent overeating. This information is not intended to replace advice given to you by your health care provider. Make sure you discuss any questions you have with your health care provider. Document Revised: 07/23/2018 Document Reviewed: 10/03/2016 Elsevier Patient Education  2020 [employer] loss Exercising to Lose Weight Exercise is structured, repetitive physical activity to improve fitness and health. Getting regular exercise is important for everyone. It is especially important if you are overweight. Being overweight increases your risk of heart disease, stroke, diabetes, high blood pressure, and several types of cancer. Reducing your calorie intake and exercising can help you lose weight. Exercise is usually categorized as moderate or vigorous intensity. To lose weight, most people need to do a certain amount  of moderate-intensity or vigorous-intensity exercise each week. Moderate-intensity exercise  Moderate-intensity exercise is any activity that gets you moving enough to burn at least three times more energy (calories) than if you were sitting. Examples of moderate exercise include:  Walking a mile in 15 minutes.  Doing light yard work.  Biking at an easy pace. Most people should get at least 150 minutes (2 hours and 30 minutes) a week of moderate-intensity exercise to maintain their body weight. Vigorous-intensity exercise Vigorous-intensity exercise is any activity that gets you moving enough to burn at least six times more calories than if you were sitting. When you exercise at this intensity, you should be working hard enough that you are not able to carry on a conversation. Examples of vigorous exercise include:  Running.  Playing a team sport, such as football, basketball, and soccer.  Jumping rope. Most people should get at least 75 minutes (1 hour and 15 minutes) a week of vigorous-intensity exercise to maintain their body weight. How can exercise affect me? When you exercise enough to burn more calories than you eat, you lose weight. Exercise also reduces body fat and builds muscle. The more muscle you have, the more calories you burn. Exercise also:  Improves mood.  Reduces stress and tension.  Improves your overall fitness, flexibility, and endurance.  Increases bone strength. The amount of exercise you need to lose weight depends on:  Your age.  The type of exercise.  Any health conditions you have.  Your overall physical ability. Talk to your health care provider about how much exercise you need and what types of activities are safe for you. What actions can I take to lose weight? Nutrition   Make changes to your diet as told by your health care provider or diet and nutrition specialist (dietitian). This may include: ? Eating fewer calories. ? Eating more  protein. ? Eating less unhealthy fats. ? Eating a diet that includes fresh fruits and vegetables, whole grains, low-fat dairy products, and lean protein. ? Avoiding foods with added fat, salt, and sugar.  Drink plenty of water while you exercise to prevent dehydration or heat stroke. Activity  Choose an activity that you enjoy and set realistic goals. Your health care provider can help you make an exercise plan that works for you.  Exercise at a moderate or vigorous intensity most days of the week. ? The intensity of exercise may vary from person to person. You can tell how intense a workout is for you by paying attention to your breathing and heartbeat. Most people will notice their breathing and heartbeat get faster with more intense exercise.  Do resistance training twice each week, such as: ? Push-ups. ? Sit-ups. ? Lifting weights. ? Using resistance bands.  Getting short amounts of exercise can be just as helpful as long structured periods of exercise. If you have trouble finding time to exercise, try to include exercise in your daily routine. ? Get up, stretch, and walk around every 30 minutes throughout the day. ? Go for a walk during your lunch break. ? Park your car farther away from your destination. ? If you take public transportation, get off one stop early and walk the rest of the way. ? Make phone calls while standing up and walking around. ? Take the stairs instead of elevators or escalators.  Wear comfortable clothes and shoes with good support.  Do not exercise so much that you hurt yourself, feel dizzy, or get very short of breath. Where to find more information  [place] Department of Health and  Human Services: ThisPath.fi  Centers for Disease Control and Prevention (CDC): [employer]com[employer]br Contact a health care provider:  Before starting a new exercise program.  If you have questions or concerns about your weight.  If you have a medical problem that keeps you from  exercising. Get help right away if you have any of the following while exercising:  Injury.  Dizziness.  Difficulty breathing or shortness of breath that does not go away when you stop exercising.  Chest pain.  Rapid heartbeat. Summary  Being overweight increases your risk of heart disease, stroke, diabetes, high blood pressure, and several types of cancer.  Losing weight happens when you burn more calories than you eat.  Reducing the amount of calories you eat in addition to getting regular moderate or vigorous exercise each week helps you lose weight. This information is not intended to replace advice given to you by your health care provider. Make sure you discuss any questions you have with your health care provider. Document Revised: 11/16/2017 Document Reviewed: 11/16/2017 Elsevier Patient Education  2020 [employer].

## 2020-11-02 ENCOUNTER — Other Ambulatory Visit: Payer: Self-pay

## 2020-11-02 ENCOUNTER — Encounter: Payer: Self-pay | Admitting: Family Medicine

## 2020-11-02 ENCOUNTER — Other Ambulatory Visit (HOSPITAL_COMMUNITY)
Admission: RE | Admit: 2020-11-02 | Discharge: 2020-11-02 | Disposition: A | Discharge status: Home / Self Care | Payer: Managed Care, Other (non HMO) | Source: Ambulatory Visit | Attending: Family Medicine | Admitting: Family Medicine

## 2020-11-02 ENCOUNTER — Ambulatory Visit (INDEPENDENT_AMBULATORY_CARE_PROVIDER_SITE_OTHER): Payer: Managed Care, Other (non HMO) | Admitting: Family Medicine

## 2020-11-02 VITALS — BP 124/80 | HR 76 | Ht 62.0 in | Wt 181.0 lb

## 2020-11-02 DIAGNOSIS — Z1211 Encounter for screening for malignant neoplasm of colon: Secondary | ICD-10-CM

## 2020-11-02 DIAGNOSIS — Z01419 Encounter for gynecological examination (general) (routine) without abnormal findings: Secondary | ICD-10-CM

## 2020-11-02 DIAGNOSIS — Z1231 Encounter for screening mammogram for malignant neoplasm of breast: Secondary | ICD-10-CM

## 2020-11-02 DIAGNOSIS — Z0000 Encounter for general adult medical examination without abnormal findings: Secondary | ICD-10-CM

## 2020-11-02 NOTE — Progress Notes (Signed)
Date:  11/02/2020   Name:  Pamela Solomon   DOB:  Jan 31, 1966   MRN:  [id number]   Chief Complaint: Annual Exam (With pap and breast exam) and Allergic Rhinitis  (Refill flonase)  Patient is a 54 year old female who presents for a comprehensive physical exam. The patient reports the following problems: none. Health maintenance has been reviewed mammogram/colonoscopy.   Lab Results  Component Value Date   CREATININE 1.04 (H) 08/28/2017   BUN 17 08/28/2017   NA 141 08/28/2017   K 4.4 08/28/2017   CL 101 08/28/2017   CO2 25 08/28/2017   Lab Results  Component Value Date   CHOL 204 (H) 08/28/2017   HDL 76 08/28/2017   LDLCALC 107 (H) 08/28/2017   TRIG 107 08/28/2017   CHOLHDL 2.7 08/28/2017   Lab Results  Component Value Date   TSH 3.500 07/27/2017   No results found for: HGBA1C Lab Results  Component Value Date   WBC 9.1 07/27/2017   HGB 14.0 07/27/2017   HCT 42.4 07/27/2017   MCV 80 07/27/2017   PLT 268 07/27/2017   No results found for: ALT, AST, GGT, ALKPHOS, BILITOT   Review of Systems  Constitutional: Negative.  Negative for chills, fatigue, fever and unexpected weight change.  HENT: Positive for sinus pressure. Negative for congestion, ear discharge, ear pain, rhinorrhea, sneezing and sore throat.   Eyes: Negative for double vision, photophobia, pain, discharge, redness and itching.  Respiratory: Negative for cough, shortness of breath, wheezing and stridor.   Gastrointestinal: Negative for abdominal pain, blood in stool, constipation, diarrhea, nausea and vomiting.  Endocrine: Negative for cold intolerance, heat intolerance, polydipsia, polyphagia and polyuria.  Genitourinary: Negative for dysuria, flank pain, frequency, hematuria, menstrual problem, pelvic pain, urgency, vaginal bleeding and vaginal discharge.  Musculoskeletal: Negative for arthralgias, back pain and myalgias.  Skin: Negative for rash.  Allergic/Immunologic: Negative for environmental  allergies and food allergies.  Neurological: Negative for dizziness, weakness, light-headedness, numbness and headaches.  Hematological: Negative for adenopathy. Does not bruise/bleed easily.  Psychiatric/Behavioral: Negative for dysphoric mood. The patient is not nervous/anxious.     There are no problems to display for this patient.   No Known Allergies  Past Surgical History:  Procedure Laterality Date  . ABDOMINAL HYSTERECTOMY      Social History   Tobacco Use  . Smoking status: Never Smoker  . Smokeless tobacco: Never Used  Substance Use Topics  . Alcohol use: No  . Drug use: No     Medication list has been reviewed and updated.  Current Meds  Medication Sig  . fluticasone (FLONASE) 50 MCG/ACT nasal spray Place 2 sprays into both nostrils daily.    PHQ 2/9 Scores 11/02/2020 08/27/2020 08/28/2017 07/27/2017  PHQ - 2 Score 0 0 0 0  PHQ- 9 Score 0 0 1 3    GAD 7 : Generalized Anxiety Score 11/02/2020 08/27/2020  Nervous, Anxious, on Edge 0 0  Control/stop worrying 0 0  Worry too much - different things 0 0  Trouble relaxing 0 0  Restless 0 0  Easily annoyed or irritable 0 0  Afraid - awful might happen 0 0  Total GAD 7 Score 0 0    BP Readings from Last 3 Encounters:  11/02/20 124/80  08/27/20 120/80  12/15/18 (!) 124/100    Physical Exam Vitals and nursing note reviewed. Exam conducted with a chaperone present.  Constitutional:      General: She is not in acute  distress.    Appearance: Normal appearance. She is well-groomed. She is not diaphoretic.  HENT:     Head: Normocephalic and atraumatic.     Jaw: There is normal jaw occlusion.     Right Ear: Hearing, tympanic membrane, ear canal and external ear normal.     Left Ear: Hearing, tympanic membrane, ear canal and external ear normal.     Nose: Nose normal.     Mouth/Throat:     Lips: Pink.     Mouth: Oropharynx is clear and moist. Mucous membranes are moist.     Pharynx: Oropharynx is clear.  Uvula midline. No pharyngeal swelling, oropharyngeal exudate, posterior oropharyngeal erythema or uvula swelling.  Eyes:     General: Lids are normal. Vision grossly intact. Gaze aligned appropriately.        Right eye: No discharge.        Left eye: No discharge.     Extraocular Movements: Extraocular movements intact and EOM normal.     Conjunctiva/sclera: Conjunctivae normal.     Pupils: Pupils are equal, round, and reactive to light.     Funduscopic exam:    Right eye: Red reflex present.        Left eye: Red reflex present. Neck:     Thyroid: No thyroid mass, thyromegaly or thyroid tenderness.     Vascular: Normal carotid pulses. No carotid bruit, hepatojugular reflux or JVD.     Trachea: Trachea and phonation normal.  Cardiovascular:     Rate and Rhythm: Normal rate and regular rhythm.     Chest Wall: PMI is not displaced. No thrill.     Pulses: Normal pulses and intact distal pulses.          Carotid pulses are 2+ on the right side and 2+ on the left side.      Radial pulses are 2+ on the right side and 2+ on the left side.       Femoral pulses are 2+ on the right side and 2+ on the left side.      Popliteal pulses are 2+ on the right side and 2+ on the left side.       Dorsalis pedis pulses are 2+ on the right side and 2+ on the left side.       Posterior tibial pulses are 2+ on the right side and 2+ on the left side.     Heart sounds: Normal heart sounds. No murmur heard.  No systolic murmur is present. No friction rub. No gallop. No S3 or S4 sounds.   Pulmonary:     Effort: Pulmonary effort is normal.     Breath sounds: Normal breath sounds and air entry. No decreased breath sounds, wheezing, rhonchi or rales.  Chest:     Chest wall: No mass.  Breasts:     Right: Normal. No swelling, bleeding, inverted nipple, mass, nipple discharge, skin change, tenderness, axillary adenopathy or supraclavicular adenopathy.     Left: Normal. No swelling, bleeding, inverted nipple, mass,  nipple discharge, skin change, tenderness, axillary adenopathy or supraclavicular adenopathy.    Abdominal:     General: Bowel sounds are normal.     Palpations: Abdomen is soft. There is no hepatomegaly, splenomegaly or mass.     Tenderness: There is no abdominal tenderness. There is no guarding.     Hernia: No hernia is present. There is no hernia in the left inguinal area or right inguinal area.  Genitourinary:    General: Normal vulva.  Exam position: Lithotomy position.     Labia:        Right: No rash or lesion.        Left: No rash or lesion.      Urethra: No prolapse.     Vagina: Normal.     Uterus: Absent.      Adnexa: Right adnexa normal and left adnexa normal.       Right: No mass or tenderness.         Left: No mass or tenderness.    Musculoskeletal:        General: No edema. Normal range of motion.     Cervical back: Normal, full passive range of motion without pain, normal range of motion and neck supple.     Thoracic back: Normal.     Lumbar back: Normal.     Right lower leg: No edema.     Left lower leg: No edema.  Lymphadenopathy:     Head:     Right side of head: No submandibular or tonsillar adenopathy.     Left side of head: No submandibular or tonsillar adenopathy.     Cervical: No cervical adenopathy.     Right cervical: No superficial, deep or posterior cervical adenopathy.    Left cervical: No superficial, deep or posterior cervical adenopathy.     Upper Body:     Right upper body: No supraclavicular or axillary adenopathy.     Left upper body: No supraclavicular or axillary adenopathy.     Lower Body: No right inguinal adenopathy. No left inguinal adenopathy.  Skin:    General: Skin is warm and dry.     Capillary Refill: Capillary refill takes less than 2 seconds.     Comments: Pigmented seborrheic keratoses  Neurological:     Mental Status: She is alert.     Cranial Nerves: Cranial nerves are intact.     Sensory: Sensation is intact.      Motor: Motor function is intact.     Deep Tendon Reflexes: Reflexes are normal and symmetric.     Reflex Scores:      Tricep reflexes are 2+ on the right side and 2+ on the left side.      Bicep reflexes are 2+ on the right side and 2+ on the left side.      Brachioradialis reflexes are 2+ on the right side and 2+ on the left side.      Patellar reflexes are 2+ on the right side and 2+ on the left side.      Achilles reflexes are 2+ on the right side and 2+ on the left side. Psychiatric:        Behavior: Behavior is cooperative.     Wt Readings from Last 3 Encounters:  11/02/20 181 lb (82.1 kg)  08/27/20 185 lb (83.9 kg)  12/15/18 185 lb (83.9 kg)    BP 124/80   Pulse 76   Ht 5\' 2"  (1.575 m)   Wt 181 lb (82.1 kg)   BMI 33.11 kg/m   Assessment and Plan:  1. Encounter for annual physical exam No subjective/objective concerns noted during history and physical exam.Lolah D Malachi is a 54 y.o. female who presents today for her Complete Annual Exam. She feels well. She reports exercising . She reports she is sleeping well. Immunizations are reviewed and recommendations provided.   Age appropriate screening tests are discussed. Counseling given for risk factor reduction interventions.  2. Breast cancer screening by mammogram  Patient had manual breast exam done today there was no palpable mass no abnormalities noted.  Patient was scheduled for bilateral 3D mammogram. - MM 3D SCREEN BREAST BILATERAL; Future  3. Gynecologic exam normal GYN exam was done today with Pap of the vaginal cuff.  There is no palpable pelvic masses ovaries were nonpalpable rectal exam was normal - Cytology - PAP  4. Colon cancer screening Rectal exam was normal on direct exam.  Guaiac was negative. - Ambulatory referral to Gastroenterology

## 2020-11-06 LAB — HM PAP SMEAR: HM Pap smear: NEGATIVE

## 2020-11-06 LAB — CYTOLOGY - PAP
Comment: NEGATIVE
Diagnosis: NEGATIVE
High risk HPV: NEGATIVE

## 2020-11-07 ENCOUNTER — Ambulatory Visit: Admission: RE | Admit: 2020-11-07 | Payer: Self-pay | Source: Ambulatory Visit

## 2021-04-11 ENCOUNTER — Ambulatory Visit: Payer: Managed Care, Other (non HMO) | Admitting: Family Medicine

## 2021-04-11 ENCOUNTER — Other Ambulatory Visit: Payer: Self-pay

## 2021-04-11 ENCOUNTER — Encounter: Payer: Self-pay | Admitting: Family Medicine

## 2021-04-11 VITALS — BP 152/100 | HR 68 | Ht 62.0 in | Wt 181.0 lb

## 2021-04-11 DIAGNOSIS — Z6833 Body mass index (BMI) 33.0-33.9, adult: Secondary | ICD-10-CM | POA: Diagnosis not present

## 2021-04-11 DIAGNOSIS — J301 Allergic rhinitis due to pollen: Secondary | ICD-10-CM | POA: Diagnosis not present

## 2021-04-11 DIAGNOSIS — I10 Essential (primary) hypertension: Secondary | ICD-10-CM

## 2021-04-11 DIAGNOSIS — H6123 Impacted cerumen, bilateral: Secondary | ICD-10-CM | POA: Diagnosis not present

## 2021-04-11 MED ORDER — FLUTICASONE PROPIONATE 50 MCG/ACT NA SUSP: 2.0000 | Freq: Every day | NASAL | 11 refills | Status: DC

## 2021-04-11 MED ORDER — LISINOPRIL-HYDROCHLOROTHIAZIDE 10-12.5 MG PO TABS: 1.0000 | ORAL_TABLET | Freq: Every day | ORAL | 0 refills | Status: DC

## 2021-04-11 MED ORDER — CARBAMIDE PEROXIDE 6.5 % OT SOLN: 5.0000 [drp] | Freq: Two times a day (BID) | OTIC | 0 refills | Status: DC

## 2021-04-11 NOTE — Patient Instructions (Addendum)
How to Take Your Blood Pressure Blood pressure is a measurement of how strongly your blood is pressing against the walls of your arteries. Arteries are blood vessels that carry blood from your heart throughout your body. Your health care provider takes your blood pressure at each office visit. You can also take your own blood pressure at home with a blood pressure monitor. You may need to take your own blood pressure to:  Confirm a diagnosis of high blood pressure (hypertension).  Monitor your blood pressure over time.  Make sure your blood pressure medicine is working. Supplies needed:  Blood pressure monitor.  Dining room chair to sit in.  Table or desk.  Small notebook and pencil or pen. How to prepare To get the most accurate reading, avoid the following for 30 minutes before you check your blood pressure:  Drinking caffeine.  Drinking alcohol.  Eating.  Smoking.  Exercising. Five minutes before you check your blood pressure:  Use the bathroom and urinate so that you have an empty bladder.  Sit quietly in a dining room chair. Do not sit in a soft couch or an armchair. Do not talk. How to take your blood pressure To check your blood pressure, follow the instructions in the manual that came with your blood pressure monitor. If you have a digital blood pressure monitor, the instructions may be as follows: 1. Sit up straight in a chair. 2. Place your feet on the floor. Do not cross your ankles or legs. 3. Rest your left arm at the level of your heart on a table or desk or on the arm of a chair. 4. Pull up your shirt sleeve. 5. Wrap the blood pressure cuff around the upper part of your left arm, 1 inch (2.5 cm) above your elbow. It is best to wrap the cuff around bare skin. 6. Fit the cuff snugly around your arm. You should be able to place only one finger between the cuff and your arm. 7. Position the cord so that it rests in the bend of your elbow. 8. Press the power  button. 9. Sit quietly while the cuff inflates and deflates. 10. Read the digital reading on the monitor screen and write the numbers down (record them) in a notebook. 11. Wait 2-3 minutes, then repeat the steps, starting at step 1.   What does my blood pressure reading mean? A blood pressure reading consists of a higher number over a lower number. Ideally, your blood pressure should be below 120/80. The first ("top") number is called the systolic pressure. It is a measure of the pressure in your arteries as your heart beats. The second ("bottom") number is called the diastolic pressure. It is a measure of the pressure in your arteries as the heart relaxes. Blood pressure is classified into five stages. The following are the stages for adults who do not have a short-term serious illness or a chronic condition. Systolic pressure and diastolic pressure are measured in a unit called mm Hg (millimeters of mercury).  Normal  Systolic pressure: below 120.  Diastolic pressure: below 80. Elevated  Systolic pressure: 120-129.  Diastolic pressure: below 80. Hypertension stage 1  Systolic pressure: 130-139.  Diastolic pressure: 80-89. Hypertension stage 2  Systolic pressure: 140 or above.  Diastolic pressure: 90 or above. You can have elevated blood pressure or hypertension even if only the systolic or only the diastolic number in your reading is higher than normal. Follow these instructions at home:  Check your blood   pressure as often as recommended by your health care provider.  Check your blood pressure at the same time every day.  Take your monitor to the next appointment with your health care provider to make sure that: ? You are using it correctly. ? It provides accurate readings.  Be sure you understand what your goal blood pressure numbers are.  Tell your health care provider if you are having any side effects from blood pressure medicine.  Keep all follow-up visits as told by  your health care provider. This is important. General tips  Your health care provider can suggest a reliable monitor that will meet your needs. There are several types of home blood pressure monitors.  Choose a monitor that has an arm cuff. Do not choose a monitor that measures your blood pressure from your wrist or finger.  Choose a cuff that wraps snugly around your upper arm. You should be able to fit only one finger between your arm and the cuff.  You can buy a blood pressure monitor at most drugstores or online. Where to find more information American Heart Association: www.heart.org Contact a health care provider if:  Your blood pressure is consistently high. Get help right away if:  Your systolic blood pressure is higher than 180.  Your diastolic blood pressure is higher than 120. Summary  Blood pressure is a measurement of how strongly your blood is pressing against the walls of your arteries.  A blood pressure reading consists of a higher number over a lower number. Ideally, your blood pressure should be below 120/80.  Check your blood pressure at the same time every day.  Avoid caffeine, alcohol, smoking, and exercise for 30 minutes prior to checking your blood pressure. These agents can affect the accuracy of the blood pressure reading. This information is not intended to replace advice given to you by your health care provider. Make sure you discuss any questions you have with your health care provider. Document Revised: 10/28/2019 Document Reviewed: 10/28/2019 Elsevier Patient Education  2021 Elsevier Inc.  https://www.mata.com/.pdf">  DASH Eating Plan DASH stands for Dietary Approaches to Stop Hypertension. The DASH eating plan is a healthy eating plan that has been shown to:  Reduce high blood pressure (hypertension).  Reduce your risk for type 2 diabetes, heart disease, and stroke.  Help with weight loss. What are tips  for following this plan? Reading food labels  Check food labels for the amount of salt (sodium) per serving. Choose foods with less than 5 percent of the Daily Value of sodium. Generally, foods with less than 300 milligrams (mg) of sodium per serving fit into this eating plan.  To find whole grains, look for the word "whole" as the first word in the ingredient list. Shopping  Buy products labeled as "low-sodium" or "no salt added."  Buy fresh foods. Avoid canned foods and pre-made or frozen meals. Cooking  Avoid adding salt when cooking. Use salt-free seasonings or herbs instead of table salt or sea salt. Check with your health care provider or pharmacist before using salt substitutes.  Do not fry foods. Cook foods using healthy methods such as baking, boiling, grilling, roasting, and broiling instead.  Cook with heart-healthy oils, such as olive, canola, avocado, soybean, or sunflower oil. Meal planning  Eat a balanced diet that includes: ? 4 or more servings of fruits and 4 or more servings of vegetables each day. Try to fill one-half of your plate with fruits and vegetables. ? 6-8 servings of whole  grains each day. ? Less than 6 oz (170 g) of lean meat, poultry, or fish each day. A 3-oz (85-g) serving of meat is about the same size as a deck of cards. One egg equals 1 oz (28 g). ? 2-3 servings of low-fat dairy each day. One serving is 1 cup (237 mL). ? 1 serving of nuts, seeds, or beans 5 times each week. ? 2-3 servings of heart-healthy fats. Healthy fats called omega-3 fatty acids are found in foods such as walnuts, flaxseeds, fortified milks, and eggs. These fats are also found in cold-water fish, such as sardines, salmon, and mackerel.  Limit how much you eat of: ? Canned or prepackaged foods. ? Food that is high in trans fat, such as some fried foods. ? Food that is high in saturated fat, such as fatty meat. ? Desserts and other sweets, sugary drinks, and other foods with added  sugar. ? Full-fat dairy products.  Do not salt foods before eating.  Do not eat more than 4 egg yolks a week.  Try to eat at least 2 vegetarian meals a week.  Eat more home-cooked food and less restaurant, buffet, and fast food.   Lifestyle  When eating at a restaurant, ask that your food be prepared with less salt or no salt, if possible.  If you drink alcohol: ? Limit how much you use to:  0-1 drink a day for women who are not pregnant.  0-2 drinks a day for men. ? Be aware of how much alcohol is in your drink. In the [place] one drink equals one 12 oz bottle of beer (355 mL), one 5 oz glass of wine (148 mL), or one 1 oz glass of hard liquor (44 mL). General information  Avoid eating more than 2,300 mg of salt a day. If you have hypertension, you may need to reduce your sodium intake to 1,500 mg a day.  Work with your health care provider to maintain a healthy body weight or to lose weight. Ask what an ideal weight is for you.  Get at least 30 minutes of exercise that causes your heart to beat faster (aerobic exercise) most days of the week. Activities may include walking, swimming, or biking.  Work with your health care provider or dietitian to adjust your eating plan to your individual calorie needs. What foods should I eat? Fruits All fresh, dried, or frozen fruit. Canned fruit in natural juice (without added sugar). Vegetables Fresh or frozen vegetables (raw, steamed, roasted, or grilled). Low-sodium or reduced-sodium tomato and vegetable juice. Low-sodium or reduced-sodium tomato sauce and tomato paste. Low-sodium or reduced-sodium canned vegetables. Grains Whole-grain or whole-wheat bread. Whole-grain or whole-wheat pasta. Brown rice. Orpah Cobb. Bulgur. Whole-grain and low-sodium cereals. Pita bread. Low-fat, low-sodium crackers. Whole-wheat flour tortillas. Meats and other proteins Skinless chicken or [place]. Ground chicken or [place]. Pork with fat trimmed off.  Fish and seafood. Egg whites. Dried beans, peas, or lentils. Unsalted nuts, nut butters, and seeds. Unsalted canned beans. Lean cuts of beef with fat trimmed off. Low-sodium, lean precooked or cured meat, such as sausages or meat loaves. Dairy Low-fat (1%) or fat-free (skim) milk. Reduced-fat, low-fat, or fat-free cheeses. Nonfat, low-sodium ricotta or cottage cheese. Low-fat or nonfat yogurt. Low-fat, low-sodium cheese. Fats and oils Soft margarine without trans fats. Vegetable oil. Reduced-fat, low-fat, or light mayonnaise and salad dressings (reduced-sodium). Canola, safflower, olive, avocado, soybean, and sunflower oils. Avocado. Seasonings and condiments Herbs. Spices. Seasoning mixes without salt. Other foods Unsalted popcorn  and pretzels. Fat-free sweets. The items listed above may not be a complete list of foods and beverages you can eat. Contact a dietitian for more information. What foods should I avoid? Fruits Canned fruit in a light or heavy syrup. Fried fruit. Fruit in cream or butter sauce. Vegetables Creamed or fried vegetables. Vegetables in a cheese sauce. Regular canned vegetables (not low-sodium or reduced-sodium). Regular canned tomato sauce and paste (not low-sodium or reduced-sodium). Regular tomato and vegetable juice (not low-sodium or reduced-sodium). Rosita Fire. Olives. Grains Baked goods made with fat, such as croissants, muffins, or some breads. Dry pasta or rice meal packs. Meats and other proteins Fatty cuts of meat. Ribs. Fried meat. Tomasa Blase. Bologna, salami, and other precooked or cured meats, such as sausages or meat loaves. Fat from the back of a pig (fatback). Bratwurst. Salted nuts and seeds. Canned beans with added salt. Canned or smoked fish. Whole eggs or egg yolks. Chicken or [place] with skin. Dairy Whole or 2% milk, cream, and half-and-half. Whole or full-fat cream cheese. Whole-fat or sweetened yogurt. Full-fat cheese. Nondairy creamers. Whipped toppings.  Processed cheese and cheese spreads. Fats and oils Butter. Stick margarine. Lard. Shortening. Ghee. Bacon fat. Tropical oils, such as coconut, palm kernel, or palm oil. Seasonings and condiments Onion salt, garlic salt, seasoned salt, table salt, and sea salt. Worcestershire sauce. Tartar sauce. Barbecue sauce. Teriyaki sauce. Soy sauce, including reduced-sodium. Steak sauce. Canned and packaged gravies. Fish sauce. Oyster sauce. Cocktail sauce. Store-bought horseradish. Ketchup. Mustard. Meat flavorings and tenderizers. Bouillon cubes. Hot sauces. Pre-made or packaged marinades. Pre-made or packaged taco seasonings. Relishes. Regular salad dressings. Other foods Salted popcorn and pretzels. The items listed above may not be a complete list of foods and beverages you should avoid. Contact a dietitian for more information. Where to find more information  National Heart, Lung, and Blood Institute: PopSteam.is  American Heart Association: www.heart.org  Academy of Nutrition and Dietetics: www.eatright.org  National Kidney Foundation: www.kidney.org Summary  The DASH eating plan is a healthy eating plan that has been shown to reduce high blood pressure (hypertension). It may also reduce your risk for type 2 diabetes, heart disease, and stroke.  When on the DASH eating plan, aim to eat more fresh fruits and vegetables, whole grains, lean proteins, low-fat dairy, and heart-healthy fats.  With the DASH eating plan, you should limit salt (sodium) intake to 2,300 mg a day. If you have hypertension, you may need to reduce your sodium intake to 1,500 mg a day.  Work with your health care provider or dietitian to adjust your eating plan to your individual calorie needs. This information is not intended to replace advice given to you by your health care provider. Make sure you discuss any questions you have with your health care provider. Document Revised: 10/07/2019 Document Reviewed:  10/07/2019 Elsevier Patient Education  2021 [employer].

## 2021-04-11 NOTE — Addendum Note (Signed)
Addended by: Duanne Limerick on: 04/11/2021 08:54 AM   Modules accepted: Orders, Level of Service

## 2021-04-11 NOTE — Progress Notes (Addendum)
Date:  04/11/2021   Name:  Pamela Solomon   DOB:  [birth year]   MRN:  [id number]   Chief Complaint: Hypertension (Pt complaining of elevated BP readings at home. Using arm cuff at home. Been elevated for 2 weeks.)  Hypertension This is a new problem. The current episode started in the past 7 days. The problem has been gradually improving since onset. The problem is controlled. Pertinent negatives include no anxiety, blurred vision, chest pain, headaches, malaise/fatigue, neck pain, orthopnea, palpitations, peripheral edema, PND, shortness of breath or sweats. There are no associated agents to hypertension. There are no known risk factors for coronary artery disease. Past treatments include lifestyle changes. The current treatment provides moderate improvement. There are no compliance problems.  There is no history of angina, kidney disease, CAD/MI, CVA, heart failure, left ventricular hypertrophy, PVD or retinopathy. There is no history of chronic renal disease, a hypertension causing med or renovascular disease.    Lab Results  Component Value Date   CREATININE 1.04 (H) 08/28/2017   BUN 17 08/28/2017   NA 141 08/28/2017   K 4.4 08/28/2017   CL 101 08/28/2017   CO2 25 08/28/2017   Lab Results  Component Value Date   CHOL 204 (H) 08/28/2017   HDL 76 08/28/2017   LDLCALC 107 (H) 08/28/2017   TRIG 107 08/28/2017   CHOLHDL 2.7 08/28/2017   Lab Results  Component Value Date   TSH 3.500 07/27/2017   No results found for: HGBA1C Lab Results  Component Value Date   WBC 9.1 07/27/2017   HGB 14.0 07/27/2017   HCT 42.4 07/27/2017   MCV 80 07/27/2017   PLT 268 07/27/2017   No results found for: ALT, AST, GGT, ALKPHOS, BILITOT   Review of Systems  Constitutional: Negative for chills, fever and malaise/fatigue.  HENT: Negative for drooling, ear discharge, ear pain and sore throat.   Eyes: Negative for blurred vision.  Respiratory: Negative for cough, shortness of breath and  wheezing.   Cardiovascular: Negative for chest pain, palpitations, orthopnea, leg swelling and PND.  Gastrointestinal: Negative for abdominal pain, blood in stool, constipation, diarrhea and nausea.  Endocrine: Negative for polydipsia.  Genitourinary: Negative for dysuria, frequency, hematuria and urgency.  Musculoskeletal: Negative for back pain, myalgias and neck pain.  Skin: Negative for rash.  Allergic/Immunologic: Negative for environmental allergies.  Neurological: Negative for dizziness and headaches.  Hematological: Does not bruise/bleed easily.  Psychiatric/Behavioral: Negative for suicidal ideas. The patient is not nervous/anxious.     There are no problems to display for this patient.   No Known Allergies  Past Surgical History:  Procedure Laterality Date  . ABDOMINAL HYSTERECTOMY      Social History   Tobacco Use  . Smoking status: Never Smoker  . Smokeless tobacco: Never Used  Substance Use Topics  . Alcohol use: No  . Drug use: No     Medication list has been reviewed and updated.  Current Meds  Medication Sig  . fluticasone (FLONASE) 50 MCG/ACT nasal spray Place 2 sprays into both nostrils daily.    PHQ 2/9 Scores 04/11/2021 11/02/2020 08/27/2020 08/28/2017  PHQ - 2 Score 0 0 0 0  PHQ- 9 Score 0 0 0 1    GAD 7 : Generalized Anxiety Score 04/11/2021 11/02/2020 08/27/2020  Nervous, Anxious, on Edge 0 0 0  Control/stop worrying 0 0 0  Worry too much - different things 0 0 0  Trouble relaxing 0 0 0  Restless 0 0  0  Easily annoyed or irritable 0 0 0  Afraid - awful might happen 0 0 0  Total GAD 7 Score 0 0 0  Anxiety Difficulty Not difficult at all - -    BP Readings from Last 3 Encounters:  04/11/21 (!) 152/100  11/02/20 124/80  08/27/20 120/80    Physical Exam Vitals and nursing note reviewed.  Constitutional:      Appearance: She is well-developed.  HENT:     Head: Normocephalic.     Right Ear: Tympanic membrane, ear canal and external  ear normal.     Left Ear: Tympanic membrane, ear canal and external ear normal.  Eyes:     General: Lids are everted, no foreign bodies appreciated. No scleral icterus.       Left eye: No foreign body or hordeolum.     Conjunctiva/sclera: Conjunctivae normal.     Right eye: Right conjunctiva is not injected.     Left eye: Left conjunctiva is not injected.     Pupils: Pupils are equal, round, and reactive to light.  Neck:     Thyroid: No thyromegaly.     Vascular: No JVD.     Trachea: No tracheal deviation.  Cardiovascular:     Rate and Rhythm: Normal rate and regular rhythm.     Heart sounds: Normal heart sounds. No murmur heard. No friction rub. No gallop.   Pulmonary:     Effort: Pulmonary effort is normal. No respiratory distress.     Breath sounds: Normal breath sounds. No wheezing or rales.  Abdominal:     General: Bowel sounds are normal.     Palpations: Abdomen is soft. There is no mass.     Tenderness: There is no abdominal tenderness. There is no guarding or rebound.  Musculoskeletal:        General: No tenderness. Normal range of motion.     Cervical back: Normal range of motion and neck supple.  Lymphadenopathy:     Cervical: No cervical adenopathy.  Skin:    General: Skin is warm.     Findings: No rash.  Neurological:     Mental Status: She is alert and oriented to person, place, and time.     Cranial Nerves: No cranial nerve deficit.     Deep Tendon Reflexes: Reflexes normal.  Psychiatric:        Mood and Affect: Mood is not anxious or depressed.     Wt Readings from Last 3 Encounters:  04/11/21 181 lb (82.1 kg)  11/02/20 181 lb (82.1 kg)  08/27/20 185 lb (83.9 kg)    BP (!) 152/100   Pulse 68   Ht 5\' 2"  (1.575 m)   Wt 181 lb (82.1 kg)   BMI 33.11 kg/m   Assessment and Plan: 1. Primary hypertension Chronic.  Controlled.  Stable.  Blood pressure today is 152/100.  We will initiate lisinopril hydrochlorothiazide 10-12.5 mg 1 a day.  Patient is also  being given Dash diet.  It has been suggested the patient to return in 6 weeks and we will recheck blood pressure on medication and with dietary approach. - lisinopril-hydrochlorothiazide (ZESTORETIC) 10-12.5 MG tablet; Take 1 tablet by mouth daily.  Dispense: 60 tablet; Refill: 0  2. Seasonal allergic rhinitis due to pollen Chronic.  Controlled.  Stable.  Refill Flonase nasal spray.  Patient's been cautioned about using Sudafed as this may adversely affect her blood pressure. - fluticasone (FLONASE) 50 MCG/ACT nasal spray; Place 2 sprays into both nostrils  daily.  Dispense: 16 g; Refill: 11  3. BMI 33.0-33.9,adult Health risks of being over weight were discussed and patient was counseled on weight loss options and exercise.  4.  Cerumen impaction: Chronic.  Persistent.  Stable.  Patient has been given Debrox drops to use as well as been instructed to obtain a bulb syringe and to irrigate ears to remove wax and if unsuccessful will refer to ENT.  Patient has been told not to repeat not to use Q-tips.

## 2021-05-24 ENCOUNTER — Ambulatory Visit: Payer: Managed Care, Other (non HMO) | Admitting: Family Medicine

## 2021-05-24 ENCOUNTER — Other Ambulatory Visit: Payer: Self-pay

## 2021-05-24 ENCOUNTER — Encounter: Payer: Self-pay | Admitting: Family Medicine

## 2021-05-24 VITALS — BP 130/100 | HR 80 | Ht 62.0 in | Wt 176.0 lb

## 2021-05-24 DIAGNOSIS — I10 Essential (primary) hypertension: Secondary | ICD-10-CM | POA: Diagnosis not present

## 2021-05-24 MED ORDER — LOSARTAN POTASSIUM-HCTZ 100-25 MG PO TABS: 1.0000 | ORAL_TABLET | Freq: Every day | ORAL | 0 refills | Status: DC

## 2021-05-24 NOTE — Progress Notes (Signed)
Date:  05/24/2021   Name:  Pamela Solomon   DOB:  1965-11-21   MRN:  [id number]   Chief Complaint: Hypertension  Hypertension This is a new problem. The current episode started more than 1 month ago. The problem has been gradually improving since onset. The problem is controlled. Pertinent negatives include no anxiety, blurred vision, chest pain, headaches, malaise/fatigue, neck pain, orthopnea, palpitations, peripheral edema, PND, shortness of breath or sweats. Agents associated with hypertension include decongestants. Past treatments include ACE inhibitors and diuretics. The current treatment provides moderate improvement. There are no compliance problems.  There is no history of angina, kidney disease, CAD/MI, CVA, heart failure, left ventricular hypertrophy, PVD or retinopathy. There is no history of chronic renal disease, a hypertension causing med or renovascular disease.   Lab Results  Component Value Date   CREATININE 1.04 (H) 08/28/2017   BUN 17 08/28/2017   NA 141 08/28/2017   K 4.4 08/28/2017   CL 101 08/28/2017   CO2 25 08/28/2017   Lab Results  Component Value Date   CHOL 204 (H) 08/28/2017   HDL 76 08/28/2017   LDLCALC 107 (H) 08/28/2017   TRIG 107 08/28/2017   CHOLHDL 2.7 08/28/2017   Lab Results  Component Value Date   TSH 3.500 07/27/2017   No results found for: HGBA1C Lab Results  Component Value Date   WBC 9.1 07/27/2017   HGB 14.0 07/27/2017   HCT 42.4 07/27/2017   MCV 80 07/27/2017   PLT 268 07/27/2017   No results found for: ALT, AST, GGT, ALKPHOS, BILITOT   Review of Systems  Constitutional:  Negative for chills, fever and malaise/fatigue.  HENT:  Negative for drooling, ear discharge, ear pain and sore throat.   Eyes:  Negative for blurred vision.  Respiratory:  Negative for cough, shortness of breath and wheezing.   Cardiovascular:  Negative for chest pain, palpitations, orthopnea, leg swelling and PND.  Gastrointestinal:  Negative for  abdominal pain, blood in stool, constipation, diarrhea and nausea.  Endocrine: Negative for polydipsia.  Genitourinary:  Negative for dysuria, frequency, hematuria and urgency.  Musculoskeletal:  Negative for back pain, myalgias and neck pain.  Skin:  Negative for rash.  Allergic/Immunologic: Negative for environmental allergies.  Neurological:  Negative for dizziness and headaches.  Hematological:  Does not bruise/bleed easily.  Psychiatric/Behavioral:  Negative for suicidal ideas. The patient is not nervous/anxious.    There are no problems to display for this patient.   No Known Allergies  Past Surgical History:  Procedure Laterality Date   ABDOMINAL HYSTERECTOMY      Social History   Tobacco Use   Smoking status: Never   Smokeless tobacco: Never  Substance Use Topics   Alcohol use: No   Drug use: No     Medication list has been reviewed and updated.  Current Meds  Medication Sig   carbamide peroxide (DEBROX) 6.5 % OTIC solution Place 5 drops into both ears 2 (two) times daily.   fluticasone (FLONASE) 50 MCG/ACT nasal spray Place 2 sprays into both nostrils daily.   lisinopril-hydrochlorothiazide (ZESTORETIC) 10-12.5 MG tablet Take 1 tablet by mouth daily.    PHQ 2/9 Scores 04/11/2021 11/02/2020 08/27/2020 08/28/2017  PHQ - 2 Score 0 0 0 0  PHQ- 9 Score 0 0 0 1    GAD 7 : Generalized Anxiety Score 04/11/2021 11/02/2020 08/27/2020  Nervous, Anxious, on Edge 0 0 0  Control/stop worrying 0 0 0  Worry too much - different things  0 0 0  Trouble relaxing 0 0 0  Restless 0 0 0  Easily annoyed or irritable 0 0 0  Afraid - awful might happen 0 0 0  Total GAD 7 Score 0 0 0  Anxiety Difficulty Not difficult at all - -    BP Readings from Last 3 Encounters:  05/24/21 (!) 130/100  04/11/21 (!) 152/100  11/02/20 124/80    Physical Exam Vitals and nursing note reviewed.  Constitutional:      General: She is not in acute distress.    Appearance: She is not  diaphoretic.  HENT:     Head: Normocephalic and atraumatic.     Right Ear: Tympanic membrane, ear canal and external ear normal.     Left Ear: Tympanic membrane, ear canal and external ear normal.     Nose: Nose normal. No congestion or rhinorrhea.     Mouth/Throat:     Mouth: Mucous membranes are moist.     Pharynx: No oropharyngeal exudate or posterior oropharyngeal erythema.  Eyes:     General:        Right eye: No discharge.        Left eye: No discharge.     Conjunctiva/sclera: Conjunctivae normal.     Pupils: Pupils are equal, round, and reactive to light.  Neck:     Thyroid: No thyromegaly.     Vascular: No JVD.  Cardiovascular:     Rate and Rhythm: Normal rate and regular rhythm.     Heart sounds: Normal heart sounds. No murmur heard.   No friction rub. No gallop.  Pulmonary:     Effort: Pulmonary effort is normal.     Breath sounds: Normal breath sounds. No wheezing, rhonchi or rales.  Abdominal:     General: Bowel sounds are normal.     Palpations: Abdomen is soft. There is no mass.     Tenderness: There is no abdominal tenderness. There is no guarding.  Musculoskeletal:        General: Normal range of motion.     Cervical back: Normal range of motion and neck supple.  Lymphadenopathy:     Cervical: No cervical adenopathy.  Skin:    General: Skin is warm and dry.  Neurological:     Mental Status: She is alert.     Deep Tendon Reflexes: Reflexes are normal and symmetric.    Wt Readings from Last 3 Encounters:  05/24/21 176 lb (79.8 kg)  04/11/21 181 lb (82.1 kg)  11/02/20 181 lb (82.1 kg)    BP (!) 130/100   Pulse 80   Ht 5\' 2"  (1.575 m)   Wt 176 lb (79.8 kg)   BMI 32.19 kg/m   Assessment and Plan:  1. Primary hypertension Chronic.  Controlled.  Stable.  Patient is filling out a slight tickling in the back of the throat but no persistent cough so since her blood pressure has not reached goal either at home readings or readings here were going to  increase both aspects but changed to a losartan hydrochlorothiazide 20-25 mg dosing once a day and will recheck patient in 9 to 10 weeks. - losartan-hydrochlorothiazide (HYZAAR) 100-25 MG tablet; Take 1 tablet by mouth daily.  Dispense: 90 tablet; Refill: 0

## 2021-06-07 ENCOUNTER — Other Ambulatory Visit: Payer: Self-pay | Admitting: Family Medicine

## 2021-06-07 DIAGNOSIS — I10 Essential (primary) hypertension: Secondary | ICD-10-CM

## 2021-07-11 LAB — HEMOGLOBIN A1C: Hemoglobin A1C: 13.4

## 2021-07-11 LAB — LIPID PANEL
Cholesterol: 227 — AB (ref 0–200)
HDL: 62 (ref 35–70)
LDL Cholesterol: 128
Triglycerides: 212 — AB (ref 40–160)

## 2021-07-11 LAB — BASIC METABOLIC PANEL
Creatinine: 1.1 (ref 0.5–1.1)
Glucose: 302

## 2021-07-15 ENCOUNTER — Other Ambulatory Visit: Payer: Self-pay

## 2021-07-15 ENCOUNTER — Ambulatory Visit: Payer: Managed Care, Other (non HMO) | Admitting: Family Medicine

## 2021-07-15 ENCOUNTER — Encounter: Payer: Self-pay | Admitting: Family Medicine

## 2021-07-15 VITALS — BP 120/70 | HR 80 | Ht 62.0 in | Wt 169.0 lb

## 2021-07-15 DIAGNOSIS — R739 Hyperglycemia, unspecified: Secondary | ICD-10-CM | POA: Diagnosis not present

## 2021-07-15 DIAGNOSIS — I10 Essential (primary) hypertension: Secondary | ICD-10-CM

## 2021-07-15 DIAGNOSIS — E860 Dehydration: Secondary | ICD-10-CM | POA: Diagnosis not present

## 2021-07-15 DIAGNOSIS — R112 Nausea with vomiting, unspecified: Secondary | ICD-10-CM

## 2021-07-15 DIAGNOSIS — E1169 Type 2 diabetes mellitus with other specified complication: Secondary | ICD-10-CM | POA: Diagnosis not present

## 2021-07-15 NOTE — Progress Notes (Signed)
Date:  07/15/2021   Name:  Pamela Solomon   DOB:  1966-02-01   MRN:  [id number]   Chief Complaint: elevated A1C (13.4 last week)  Diabetes She presents for her follow-up diabetic visit. She has type 2 (uncertain) diabetes mellitus. The initial diagnosis of diabetes was made 4 months ago. Her disease course has been worsening. Pertinent negatives for hypoglycemia include no pallor. Associated symptoms include fatigue, polydipsia, polyphagia, polyuria and weight loss. Pertinent negatives for diabetes include no blurred vision, no chest pain, no visual change and no weakness. Symptoms are worsening. There are no diabetic complications. She is following a generally unhealthy diet. Her breakfast blood glucose range is generally >200 mg/dl. An ACE inhibitor/angiotensin II receptor blocker is being taken.   Lab Results  Component Value Date   CREATININE 1.1 07/11/2021   BUN 17 08/28/2017   NA 141 08/28/2017   K 4.4 08/28/2017   CL 101 08/28/2017   CO2 25 08/28/2017   Lab Results  Component Value Date   CHOL 227 (A) 07/11/2021   HDL 62 07/11/2021   LDLCALC 128 07/11/2021   TRIG 212 (A) 07/11/2021   CHOLHDL 2.7 08/28/2017   Lab Results  Component Value Date   TSH 3.500 07/27/2017   Lab Results  Component Value Date   HGBA1C 13.4 07/11/2021   Lab Results  Component Value Date   WBC 9.1 07/27/2017   HGB 14.0 07/27/2017   HCT 42.4 07/27/2017   MCV 80 07/27/2017   PLT 268 07/27/2017   No results found for: ALT, AST, GGT, ALKPHOS, BILITOT   Review of Systems  Constitutional:  Positive for fatigue and weight loss.  Eyes:  Negative for blurred vision.  Cardiovascular:  Negative for chest pain.  Endocrine: Positive for polydipsia, polyphagia and polyuria.  Skin:  Negative for color change, pallor, rash and wound.  Neurological:  Negative for weakness.   There are no problems to display for this patient.   No Known Allergies  Past Surgical History:  Procedure Laterality  Date   ABDOMINAL HYSTERECTOMY      Social History   Tobacco Use   Smoking status: Never   Smokeless tobacco: Never  Substance Use Topics   Alcohol use: No   Drug use: No     Medication list has been reviewed and updated.  Current Meds  Medication Sig   carbamide peroxide (DEBROX) 6.5 % OTIC solution Place 5 drops into both ears 2 (two) times daily.   fluticasone (FLONASE) 50 MCG/ACT nasal spray Place 2 sprays into both nostrils daily.   losartan-hydrochlorothiazide (HYZAAR) 100-25 MG tablet Take 1 tablet by mouth daily.    PHQ 2/9 Scores 07/15/2021 04/11/2021 11/02/2020 08/27/2020  PHQ - 2 Score 0 0 0 0  PHQ- 9 Score 0 0 0 0    GAD 7 : Generalized Anxiety Score 07/15/2021 04/11/2021 11/02/2020 08/27/2020  Nervous, Anxious, on Edge 0 0 0 0  Control/stop worrying 0 0 0 0  Worry too much - different things 0 0 0 0  Trouble relaxing 0 0 0 0  Restless 0 0 0 0  Easily annoyed or irritable 0 0 0 0  Afraid - awful might happen 0 0 0 0  Total GAD 7 Score 0 0 0 0  Anxiety Difficulty - Not difficult at all - -    BP Readings from Last 3 Encounters:  07/15/21 120/70  05/24/21 (!) 130/100  04/11/21 (!) 152/100    Physical Exam  Wt Readings  from Last 3 Encounters:  07/15/21 169 lb (76.7 kg)  05/24/21 176 lb (79.8 kg)  04/11/21 181 lb (82.1 kg)    BP 120/70   Pulse 80   Ht 5\' 2"  (1.575 m)   Wt 169 lb (76.7 kg)   BMI 30.91 kg/m   Assessment and Plan:  1. Type 2 diabetes mellitus with other specified complication, unspecified whether long term insulin use (HCC) New onset.  Patient had labs recently done for biometrics at [facility] and was noted to have an A1c of 13.2 and a glucose of 300.  Patient was in the process of arranging an appointment with physician for upcoming appointment for new onset diabetes when she began having issues with nausea and vomiting today.  2. Hyperglycemia [facility] evaluation notes that the above readings of A1c of 13.2 and a glucose of 302 were  noted and patient was not aware of her results until this morning.  At that time patient was already having nausea and vomiting and general malaise with increasing symptoms of dehydration.  Given her electrolytes last week is on certain if she may be ketoacidotic although that would seem to be unlikely given her readings at that time  3. Non-intractable vomiting with nausea, unspecified vomiting type New onset.  Persistent.  Non-intractable but remains nitrate and difficulty maintaining stained fluid intake.  I have suggested the patient proceed to urgent care or emergency setting to he see if likely hydration and control of her nausea and vomiting given her elevated glucose from 5 days ago.  4. Dehydration Symptoms and signs are consistent with dehydration patient has had recent weight loss of 12 pounds which may be a combination of uncontrolled diabetes and dehydration.  5. Primary hypertension Patient does have a history of hypertension and is currently stable at 120/70 with losartan hydrochlorothiazide 100-25 mg.  Upon evaluation with imagine that patient will have her hydrochlorothiazide discontinued and only maintain on the losartan.

## 2021-07-29 ENCOUNTER — Encounter: Payer: Self-pay | Admitting: Family Medicine

## 2021-07-29 ENCOUNTER — Ambulatory Visit: Payer: Self-pay

## 2021-07-29 ENCOUNTER — Telehealth (INDEPENDENT_AMBULATORY_CARE_PROVIDER_SITE_OTHER): Payer: Managed Care, Other (non HMO) | Admitting: Family Medicine

## 2021-07-29 DIAGNOSIS — U071 COVID-19: Secondary | ICD-10-CM

## 2021-07-29 NOTE — Telephone Encounter (Signed)
Message from Valora Piccolo sent at 07/29/2021  9:07 AM EDT  Pt tested positive for COVID Today and started feeling sx on Saturday. Pt sx now are a nagging cough. Pt wanted to know does she need antivirals?     Call History   Type Contact Phone/Fax User  07/29/2021 09:06 AM EDT Phone (Incoming) Solomon, Pamela D (Self) [phone] Rexene Edison) Jens Som A    Pt. Started having symptoms Saturday. Home test today positive. Has cough, fatigue, body aches. Asking about antivirals. Virtual visit today. Answer Assessment - Initial Assessment Questions 1. COVID-19 DIAGNOSIS: "Who made your COVID-19 diagnosis?" "Was it confirmed by a positive lab test or self-test?" If not diagnosed by a doctor (or NP/PA), ask "Are there lots of cases (community spread) where you live?" Note: See public health department website, if unsure.     Home 2. COVID-19 EXPOSURE: "Was there any known exposure to COVID before the symptoms began?" CDC Definition of close contact: within 6 feet (2 meters) for a total of 15 minutes or more over a 24-hour period.      Yes 3. ONSET: "When did the COVID-19 symptoms start?"      Saturday 4. WORST SYMPTOM: "What is your worst symptom?" (e.g., cough, fever, shortness of breath, muscle aches)     Cough 5. COUGH: "Do you have a cough?" If Yes, ask: "How bad is the cough?"       Yes 6. FEVER: "Do you have a fever?" If Yes, ask: "What is your temperature, how was it measured, and when did it start?"     No 7. RESPIRATORY STATUS: "Describe your breathing?" (e.g., shortness of breath, wheezing, unable to speak)      No 8. BETTER-SAME-WORSE: "Are you getting better, staying the same or getting worse compared to yesterday?"  If getting worse, ask, "In what way?"     Better 9. HIGH RISK DISEASE: "Do you have any chronic medical problems?" (e.g., asthma, heart or lung disease, weak immune system, obesity, etc.)     HTN, Diabetes 10. VACCINE: "Have you had the COVID-19 vaccine?" If Yes,  ask: "Which one, how many shots, when did you get it?"       Yes 11. BOOSTER: "Have you received your COVID-19 booster?" If Yes, ask: "Which one and when did you get it?"       X 1 12. PREGNANCY: "Is there any chance you are pregnant?" "When was your last menstrual period?"       No 13. OTHER SYMPTOMS: "Do you have any other symptoms?"  (e.g., chills, fatigue, headache, loss of smell or taste, muscle pain, sore throat)       Body aches, fatigue 14. O2 SATURATION MONITOR:  "Do you use an oxygen saturation monitor (pulse oximeter) at home?" If Yes, ask "What is your reading (oxygen level) today?" "What is your usual oxygen saturation reading?" (e.g., 95%)       No  Protocols used: Coronavirus (COVID-19) Diagnosed or Suspected-A-AH

## 2021-07-29 NOTE — Progress Notes (Signed)
Date:  07/29/2021   Name:  Pamela Solomon   DOB:  Sep 01, 1966   MRN:  [id number]   Chief Complaint: Covid Positive (Symptoms started Sat- tested pos today. Has cough- no color, small production. No fever)  I Anne Fu, MD from my office connected with this patient, Pamela Solomon, by telephone at the patient's home.  I verified that I am speaking with the correct person using two identifiers. This visit was conducted via telephone due to the Covid-19 outbreak from my office at [facility] in [place], [place]. I discussed the limitations, risks, security and privacy concerns of performing an evaluation and management service by telephone. I also discussed with the patient that there may be a patient responsible charge related to this service. The patient expressed understanding and agreed to proceed.     Lab Results  Component Value Date   CREATININE 1.1 07/11/2021   BUN 17 08/28/2017   NA 141 08/28/2017   K 4.4 08/28/2017   CL 101 08/28/2017   CO2 25 08/28/2017   Lab Results  Component Value Date   CHOL 227 (A) 07/11/2021   HDL 62 07/11/2021   LDLCALC 128 07/11/2021   TRIG 212 (A) 07/11/2021   CHOLHDL 2.7 08/28/2017   Lab Results  Component Value Date   TSH 3.500 07/27/2017   Lab Results  Component Value Date   HGBA1C 13.4 07/11/2021   Lab Results  Component Value Date   WBC 9.1 07/27/2017   HGB 14.0 07/27/2017   HCT 42.4 07/27/2017   MCV 80 07/27/2017   PLT 268 07/27/2017   No results found for: ALT, AST, GGT, ALKPHOS, BILITOT   Review of Systems  Constitutional:  Positive for diaphoresis. Negative for chills, fatigue and fever.  HENT:  Negative for congestion.   Respiratory:  Positive for cough.    There are no problems to display for this patient.   No Known Allergies  Past Surgical History:  Procedure Laterality Date   ABDOMINAL HYSTERECTOMY      Social History   Tobacco Use   Smoking status: Never   Smokeless tobacco: Never  Substance  Use Topics   Alcohol use: No   Drug use: No     Medication list has been reviewed and updated.  Current Meds  Medication Sig   carbamide peroxide (DEBROX) 6.5 % OTIC solution Place 5 drops into both ears 2 (two) times daily.   fluticasone (FLONASE) 50 MCG/ACT nasal spray Place 2 sprays into both nostrils daily.   losartan-hydrochlorothiazide (HYZAAR) 100-25 MG tablet Take 1 tablet by mouth daily.    PHQ 2/9 Scores 07/15/2021 04/11/2021 11/02/2020 08/27/2020  PHQ - 2 Score 0 0 0 0  PHQ- 9 Score 0 0 0 0    GAD 7 : Generalized Anxiety Score 07/29/2021 07/15/2021 04/11/2021 11/02/2020  Nervous, Anxious, on Edge 0 0 0 0  Control/stop worrying 0 0 0 0  Worry too much - different things 0 0 0 0  Trouble relaxing 0 0 0 0  Restless 0 0 0 0  Easily annoyed or irritable 0 0 0 0  Afraid - awful might happen 0 0 0 0  Total GAD 7 Score 0 0 0 0  Anxiety Difficulty - - Not difficult at all -    BP Readings from Last 3 Encounters:  07/15/21 120/70  05/24/21 (!) 130/100  04/11/21 (!) 152/100    Physical Exam  Wt Readings from Last 3 Encounters:  07/15/21 169 lb (76.7  kg)  05/24/21 176 lb (79.8 kg)  04/11/21 181 lb (82.1 kg)    There were no vitals taken for this visit.  Assessment and Plan:  1. COVID New onset.  Gradually improving.  Stable.  Telemedicine visit was conducted with patient who was symptomatic on Saturday and tested positive this morning.  Patient currently is only having a cough with no dyspnea or change in cognitive status.  Isolation was discussed with patient.  Symptoms have been discussed with patient's and circumstances of worsening or also discussed and patient has been made aware of if they need to go to the hospital should any of these develop.  Patient does not reach criteria for administration of oral medication. I spent 10 minutes with this patient, More than 50% of that time was spent in face to face education, counseling and care coordination.

## 2021-07-30 ENCOUNTER — Ambulatory Visit: Payer: Managed Care, Other (non HMO) | Admitting: Family Medicine

## 2021-08-02 ENCOUNTER — Other Ambulatory Visit: Payer: Self-pay

## 2021-08-02 DIAGNOSIS — Z1211 Encounter for screening for malignant neoplasm of colon: Secondary | ICD-10-CM

## 2021-08-02 NOTE — Progress Notes (Signed)
Ref GI placed 

## 2021-08-07 ENCOUNTER — Telehealth: Payer: Self-pay

## 2021-08-07 NOTE — Telephone Encounter (Signed)
CALLED PATIENT NO ANSWER LEFT VOICEMAIL FOR A CALL BACK ? ?

## 2021-08-13 ENCOUNTER — Ambulatory Visit: Payer: Managed Care, Other (non HMO) | Admitting: Family Medicine

## 2021-08-21 ENCOUNTER — Other Ambulatory Visit: Payer: Self-pay | Admitting: Family Medicine

## 2021-08-21 DIAGNOSIS — I10 Essential (primary) hypertension: Secondary | ICD-10-CM

## 2021-08-21 NOTE — Telephone Encounter (Signed)
Requested medication (s) are due for refill today: yes  Requested medication (s) are on the active medication list: yes  Last refill: 05/24/21  #90  0 refills  Future visit scheduled  yes 11/06/21  Notes to clinic: Failed due to labs, please review. Thank you.  Requested Prescriptions  Pending Prescriptions Disp Refills   losartan-hydrochlorothiazide (HYZAAR) 100-25 MG tablet [Pharmacy Med Name: LOSARTAN/HCTZ 100/25MG  TABLETS] 90 tablet 0    Sig: TAKE 1 TABLET BY MOUTH EVERY DAY     Cardiovascular: ARB + Diuretic Combos Failed - 08/21/2021  3:15 AM      Failed - K in normal range and within 180 days    Potassium  Date Value Ref Range Status  08/28/2017 4.4 3.5 - 5.2 mmol/L Final          Failed - Na in normal range and within 180 days    Sodium  Date Value Ref Range Status  08/28/2017 141 134 - 144 mmol/L Final          Failed - Ca in normal range and within 180 days    Calcium  Date Value Ref Range Status  08/28/2017 9.9 8.7 - 10.2 mg/dL Final          Passed - Cr in normal range and within 180 days    Creatinine  Date Value Ref Range Status  07/11/2021 1.1 0.5 - 1.1 Final   Creatinine, Ser  Date Value Ref Range Status  08/28/2017 1.04 (H) 0.57 - 1.00 mg/dL Final          Passed - Patient is not pregnant      Passed - Last BP in normal range    BP Readings from Last 1 Encounters:  07/15/21 120/70          Passed - Valid encounter within last 6 months    Recent Outpatient Visits           3 weeks ago COVID   [facility] Duanne Limerick, MD   1 month ago Type 2 diabetes mellitus with other specified complication, unspecified whether long term insulin use (HCC)   [facility] Duanne Limerick, MD   2 months ago Primary hypertension   [facility] Duanne Limerick, MD   4 months ago Primary hypertension   [facility] Duanne Limerick, MD   9 months ago Encounter for annual physical exam   [facility] Duanne Limerick, MD       Future Appointments             In 2 months Duanne Limerick, MD [facility], [facility]

## 2021-11-06 ENCOUNTER — Ambulatory Visit (INDEPENDENT_AMBULATORY_CARE_PROVIDER_SITE_OTHER): Payer: Managed Care, Other (non HMO) | Admitting: Family Medicine

## 2021-11-13 NOTE — Progress Notes (Signed)
Pt no showed.

## 2021-11-18 ENCOUNTER — Other Ambulatory Visit: Payer: Self-pay | Admitting: Family Medicine

## 2021-11-18 DIAGNOSIS — I10 Essential (primary) hypertension: Secondary | ICD-10-CM

## 2021-11-29 ENCOUNTER — Other Ambulatory Visit: Payer: Self-pay

## 2021-11-29 ENCOUNTER — Encounter: Payer: Self-pay | Admitting: Family Medicine

## 2021-11-29 ENCOUNTER — Ambulatory Visit: Payer: Managed Care, Other (non HMO) | Admitting: Family Medicine

## 2021-11-29 VITALS — BP 120/70 | HR 74 | Ht 62.0 in | Wt 153.0 lb

## 2021-11-29 DIAGNOSIS — I10 Essential (primary) hypertension: Secondary | ICD-10-CM

## 2021-11-29 MED ORDER — LOSARTAN POTASSIUM-HCTZ 100-25 MG PO TABS: 1.0000 | ORAL_TABLET | Freq: Every day | ORAL | 1 refills | Status: DC

## 2021-11-29 NOTE — Progress Notes (Signed)
Date:  11/29/2021   Name:  Pamela Solomon   DOB:  October 14, 1966   MRN:  [id number]   Chief Complaint: Hypertension  Hypertension This is a chronic problem. The current episode started more than 1 year ago. The problem has been gradually improving since onset. The problem is controlled. Pertinent negatives include no anxiety, blurred vision, chest pain, headaches, malaise/fatigue, neck pain, orthopnea, palpitations, peripheral edema, PND, shortness of breath or sweats. Past treatments include angiotensin blockers and diuretics. The current treatment provides moderate improvement. There are no compliance problems.  There is no history of angina, kidney disease, CAD/MI, CVA, heart failure, left ventricular hypertrophy, PVD or retinopathy. There is no history of chronic renal disease, a hypertension causing med or renovascular disease.   Lab Results  Component Value Date   NA 141 08/28/2017   K 4.4 08/28/2017   CO2 25 08/28/2017   GLUCOSE 99 08/28/2017   BUN 17 08/28/2017   CREATININE 1.1 07/11/2021   CALCIUM 9.9 08/28/2017   GFRNONAA 62 08/28/2017   Lab Results  Component Value Date   CHOL 227 (A) 07/11/2021   HDL 62 07/11/2021   LDLCALC 128 07/11/2021   TRIG 212 (A) 07/11/2021   CHOLHDL 2.7 08/28/2017   Lab Results  Component Value Date   TSH 3.500 07/27/2017   Lab Results  Component Value Date   HGBA1C 13.4 07/11/2021   Lab Results  Component Value Date   WBC 9.1 07/27/2017   HGB 14.0 07/27/2017   HCT 42.4 07/27/2017   MCV 80 07/27/2017   PLT 268 07/27/2017   No results found for: ALT, AST, GGT, ALKPHOS, BILITOT No results found for: 25OHVITD2, 25OHVITD3, VD25OH   Review of Systems  Constitutional:  Negative for chills, fever and malaise/fatigue.  HENT:  Negative for drooling, ear discharge, ear pain and sore throat.   Eyes:  Negative for blurred vision.  Respiratory:  Negative for cough, shortness of breath and wheezing.   Cardiovascular:  Negative for chest  pain, palpitations, orthopnea, leg swelling and PND.  Gastrointestinal:  Negative for abdominal pain, blood in stool, constipation, diarrhea and nausea.  Endocrine: Negative for polydipsia.  Genitourinary:  Negative for dysuria, frequency, hematuria and urgency.  Musculoskeletal:  Negative for back pain, myalgias and neck pain.  Skin:  Negative for rash.  Allergic/Immunologic: Negative for environmental allergies.  Neurological:  Negative for dizziness and headaches.  Hematological:  Does not bruise/bleed easily.  Psychiatric/Behavioral:  Negative for suicidal ideas. The patient is not nervous/anxious.    There are no problems to display for this patient.   No Known Allergies  Past Surgical History:  Procedure Laterality Date   ABDOMINAL HYSTERECTOMY      Social History   Tobacco Use   Smoking status: Never   Smokeless tobacco: Never  Substance Use Topics   Alcohol use: No   Drug use: No     Medication list has been reviewed and updated.  Current Meds  Medication Sig   carbamide peroxide (DEBROX) 6.5 % OTIC solution Place 5 drops into both ears 2 (two) times daily.   fluticasone (FLONASE) 50 MCG/ACT nasal spray Place 2 sprays into both nostrils daily.   losartan-hydrochlorothiazide (HYZAAR) 100-25 MG tablet TAKE 1 TABLET BY MOUTH EVERY DAY   metFORMIN (GLUCOPHAGE-XR) 500 MG 24 hr tablet Take 500 mg by mouth daily.   TRADJENTA 5 MG TABS tablet Take 5 mg by mouth daily.    PHQ 2/9 Scores 07/15/2021 04/11/2021 11/02/2020 08/27/2020  PHQ -  2 Score 0 0 0 0  PHQ- 9 Score 0 0 0 0    GAD 7 : Generalized Anxiety Score 07/29/2021 07/15/2021 04/11/2021 11/02/2020  Nervous, Anxious, on Edge 0 0 0 0  Control/stop worrying 0 0 0 0  Worry too much - different things 0 0 0 0  Trouble relaxing 0 0 0 0  Restless 0 0 0 0  Easily annoyed or irritable 0 0 0 0  Afraid - awful might happen 0 0 0 0  Total GAD 7 Score 0 0 0 0  Anxiety Difficulty - - Not difficult at all -    BP Readings  from Last 3 Encounters:  11/29/21 120/70  07/15/21 120/70  05/24/21 (!) 130/100    Physical Exam Vitals and nursing note reviewed.  HENT:     Right Ear: Tympanic membrane normal.     Left Ear: Tympanic membrane normal.     Nose: Nose normal.  Cardiovascular:     Heart sounds: No murmur heard.   No gallop.  Pulmonary:     Breath sounds: No wheezing or rhonchi.  Musculoskeletal:     Cervical back: Normal range of motion.  Neurological:     Mental Status: She is alert.    Wt Readings from Last 3 Encounters:  11/29/21 153 lb (69.4 kg)  07/15/21 169 lb (76.7 kg)  05/24/21 176 lb (79.8 kg)    BP 120/70    Pulse 74    Ht 5\' 2"  (1.575 m)    Wt 153 lb (69.4 kg)    BMI 27.98 kg/m   Assessment and Plan:  1. Primary hypertension Chronic.  Controlled.  Stable.  Blood pressure 120/70.  Patient is doing very well with her diet and has gotten her diabetes under control with Tradjenta and metformin and dietary discretion of carbohydrates.  In the meantime we will continue to treat her hypertension with losartan hydrochlorothiazide 100-25 mg once a day.  Will check renal function panel for electrolytes and GFR. - losartan-hydrochlorothiazide (HYZAAR) 100-25 MG tablet; Take 1 tablet by mouth daily.  Dispense: 90 tablet; Refill: 1 - Renal Function Panel

## 2021-11-30 LAB — RENAL FUNCTION PANEL
Albumin: 4.8 g/dL (ref 3.8–4.9)
BUN/Creatinine Ratio: 22 (ref 9–23)
BUN: 23 mg/dL (ref 6–24)
CO2: 27 mmol/L (ref 20–29)
Calcium: 10.6 mg/dL — ABNORMAL HIGH (ref 8.7–10.2)
Chloride: 101 mmol/L (ref 96–106)
Creatinine, Ser: 1.05 mg/dL — ABNORMAL HIGH (ref 0.57–1.00)
Glucose: 109 mg/dL — ABNORMAL HIGH (ref 70–99)
Phosphorus: 4.4 mg/dL — ABNORMAL HIGH (ref 3.0–4.3)
Potassium: 4.2 mmol/L (ref 3.5–5.2)
Sodium: 142 mmol/L (ref 134–144)
eGFR: 63 mL/min/{1.73_m2} (ref >59)

## 2022-03-31 ENCOUNTER — Ambulatory Visit: Payer: Managed Care, Other (non HMO) | Admitting: Family Medicine

## 2022-04-04 ENCOUNTER — Ambulatory Visit: Payer: Managed Care, Other (non HMO) | Admitting: Family Medicine

## 2022-04-04 ENCOUNTER — Encounter: Payer: Self-pay | Admitting: Family Medicine

## 2022-04-04 VITALS — BP 120/80 | HR 60 | Ht 62.0 in | Wt 170.0 lb

## 2022-04-04 DIAGNOSIS — I10 Essential (primary) hypertension: Secondary | ICD-10-CM

## 2022-04-04 DIAGNOSIS — E1169 Type 2 diabetes mellitus with other specified complication: Secondary | ICD-10-CM

## 2022-04-04 MED ORDER — TRADJENTA 5 MG PO TABS: 5.0000 mg | ORAL_TABLET | Freq: Every day | ORAL | 1 refills | Status: DC

## 2022-04-04 MED ORDER — METFORMIN HCL ER 500 MG PO TB24: 500.0000 mg | ORAL_TABLET | Freq: Every day | ORAL | 1 refills | Status: DC

## 2022-04-04 MED ORDER — LOSARTAN POTASSIUM-HCTZ 100-25 MG PO TABS: 1.0000 | ORAL_TABLET | Freq: Every day | ORAL | 1 refills | Status: DC

## 2022-04-04 NOTE — Patient Instructions (Signed)

## 2022-04-04 NOTE — Progress Notes (Signed)
  Date:  04/04/2022   Name:  Pamela Solomon   DOB:  12/30/1965   MRN:  [id number]   Chief Complaint: Hypertension and Diabetes  Hypertension This is a chronic problem. The current episode started more than 1 year ago. The problem has been gradually improving since onset. The problem is controlled. Pertinent negatives include no anxiety, chest pain, malaise/fatigue, orthopnea, palpitations, peripheral edema, PND or shortness of breath. Past treatments include angiotensin blockers and diuretics. The current treatment provides moderate improvement. There are no compliance problems.  There is no history of angina, kidney disease, CAD/MI, CVA, heart failure, left ventricular hypertrophy, PVD or retinopathy. There is no history of chronic renal disease, a hypertension causing med or renovascular disease.  Diabetes She presents for her follow-up diabetic visit. She has type 2 diabetes mellitus. There are no hypoglycemic associated symptoms. Pertinent negatives for diabetes include no chest pain, no polydipsia, no polyuria and no weight loss. There are no hypoglycemic complications. Symptoms are stable. There are no diabetic complications. Pertinent negatives for diabetic complications include no CVA, PVD or retinopathy. Risk factors for coronary artery disease include dyslipidemia and hypertension. Current diabetic treatment includes oral agent (dual therapy) (trajenta/metformin). Her weight is stable. She is following a generally healthy diet. Her breakfast blood glucose is taken between 8-9 am.   Lab Results  Component Value Date   NA 142 11/29/2021   K 4.2 11/29/2021   CO2 27 11/29/2021   GLUCOSE 109 (H) 11/29/2021   BUN 23 11/29/2021   CREATININE 1.05 (H) 11/29/2021   CALCIUM 10.6 (H) 11/29/2021   EGFR 63 11/29/2021   GFRNONAA 62 08/28/2017   Lab Results  Component Value Date   CHOL 227 (A) 07/11/2021   HDL 62 07/11/2021   LDLCALC 128 07/11/2021   TRIG 212 (A) 07/11/2021   CHOLHDL 2.7  08/28/2017   Lab Results  Component Value Date   TSH 3.500 07/27/2017   Lab Results  Component Value Date   HGBA1C 13.4 07/11/2021   Lab Results  Component Value Date   WBC 9.1 07/27/2017   HGB 14.0 07/27/2017   HCT 42.4 07/27/2017   MCV 80 07/27/2017   PLT 268 07/27/2017   No results found for: ALT, AST, GGT, ALKPHOS, BILITOT No results found for: 25OHVITD2, 25OHVITD3, VD25OH   Review of Systems  Constitutional:  Negative for malaise/fatigue and weight loss.  Respiratory:  Negative for shortness of breath and stridor.   Cardiovascular:  Negative for chest pain, palpitations, orthopnea, leg swelling and PND.  Endocrine: Negative for polydipsia and polyuria.   There are no problems to display for this patient.   No Known Allergies  Past Surgical History:  Procedure Laterality Date   ABDOMINAL HYSTERECTOMY      Social History   Tobacco Use   Smoking status: Never   Smokeless tobacco: Never  Substance Use Topics   Alcohol use: No   Drug use: No     Medication list has been reviewed and updated.  Current Meds  Medication Sig   carbamide peroxide (DEBROX) 6.5 % OTIC solution Place 5 drops into both ears 2 (two) times daily.   fluticasone (FLONASE) 50 MCG/ACT nasal spray Place 2 sprays into both nostrils daily.   losartan-hydrochlorothiazide (HYZAAR) 100-25 MG tablet Take 1 tablet by mouth daily.   metFORMIN (GLUCOPHAGE-XR) 500 MG 24 hr tablet Take 500 mg by mouth daily.   TRADJENTA 5 MG TABS tablet Take 5 mg by mouth daily.         04/04/2022    2:14 PM 07/29/2021   10:57 AM 07/15/2021    2:05 PM 04/11/2021    7:57 AM  GAD 7 : Generalized Anxiety Score  Nervous, Anxious, on Edge 1 0 0 0  Control/stop worrying 1 0 0 0  Worry too much - different things 1 0 0 0  Trouble relaxing 0 0 0 0  Restless 0 0 0 0  Easily annoyed or irritable 0 0 0 0  Afraid - awful might happen 0 0 0 0  Total GAD 7 Score 3 0 0 0  Anxiety Difficulty Not difficult at all   Not  difficult at all       04/04/2022    2:14 PM  Depression screen PHQ 2/9  Decreased Interest 1  Down, Depressed, Hopeless 1  PHQ - 2 Score 2  Altered sleeping 1  Tired, decreased energy 1  Change in appetite 0  Feeling bad or failure about yourself  0  Trouble concentrating 1  Moving slowly or fidgety/restless 0  Suicidal thoughts 0  PHQ-9 Score 5  Difficult doing work/chores Not difficult at all    BP Readings from Last 3 Encounters:  04/04/22 120/80  11/29/21 120/70  07/15/21 120/70    Physical Exam Vitals and nursing note reviewed. Exam conducted with a chaperone present.  Constitutional:      General: She is not in acute distress.    Appearance: She is not diaphoretic.  HENT:     Head: Normocephalic and atraumatic.     Right Ear: External ear normal. There is impacted cerumen.     Left Ear: External ear normal. There is impacted cerumen.     Nose: Nose normal.  Eyes:     General:        Right eye: No discharge.        Left eye: No discharge.     Conjunctiva/sclera: Conjunctivae normal.     Pupils: Pupils are equal, round, and reactive to light.  Neck:     Thyroid: No thyromegaly.     Vascular: No JVD.  Cardiovascular:     Rate and Rhythm: Normal rate and regular rhythm.     Heart sounds: Normal heart sounds, S1 normal and S2 normal. No murmur heard. No systolic murmur is present.  No diastolic murmur is present.    No friction rub. No gallop. No S3 or S4 sounds.  Pulmonary:     Effort: Pulmonary effort is normal.     Breath sounds: Normal breath sounds. No decreased breath sounds, wheezing, rhonchi or rales.  Abdominal:     General: Bowel sounds are normal.     Palpations: Abdomen is soft. There is no hepatomegaly, splenomegaly or mass.     Tenderness: There is no abdominal tenderness. There is no guarding.  Musculoskeletal:        General: Normal range of motion.     Cervical back: Normal range of motion and neck supple.  Lymphadenopathy:      Cervical: No cervical adenopathy.  Skin:    General: Skin is warm and dry.  Neurological:     Mental Status: She is alert.     Deep Tendon Reflexes: Reflexes are normal and symmetric.    Wt Readings from Last 3 Encounters:  04/04/22 170 lb (77.1 kg)  11/29/21 153 lb (69.4 kg)  07/15/21 169 lb (76.7 kg)    BP 120/80   Pulse 60   Ht 5' 2" (1.575 m)   Wt 170 lb (  77.1 kg)   BMI 31.09 kg/m   Assessment and Plan:  1. Type 2 diabetes mellitus with other specified complication, unspecified whether long term insulin use (HCC) Chronic.  Controlled.  Stable.  Continue with Tradjenta and metformin at prescribed dosings.  Will check A1c and renal function panel. - HgB A1c - Lipid Panel With LDL/HDL Ratio - Renal Function Panel  2. Primary hypertension Chronic.  Controlled.  Stable.  Blood pressure is 120/80.  Continue losartan hydrochlorothiazide 100/25 mg once a day. - Renal Function Panel - losartan-hydrochlorothiazide (HYZAAR) 100-25 MG tablet; Take 1 tablet by mouth daily.  Dispense: 90 tablet; Refill: 1

## 2022-04-05 LAB — RENAL FUNCTION PANEL
Albumin: 4.6 g/dL (ref 3.8–4.9)
BUN/Creatinine Ratio: 20 (ref 9–23)
BUN: 20 mg/dL (ref 6–24)
CO2: 24 mmol/L (ref 20–29)
Calcium: 10.5 mg/dL — ABNORMAL HIGH (ref 8.7–10.2)
Chloride: 99 mmol/L (ref 96–106)
Creatinine, Ser: 1.01 mg/dL — ABNORMAL HIGH (ref 0.57–1.00)
Glucose: 92 mg/dL (ref 70–99)
Phosphorus: 4.6 mg/dL — ABNORMAL HIGH (ref 3.0–4.3)
Potassium: 3.9 mmol/L (ref 3.5–5.2)
Sodium: 143 mmol/L (ref 134–144)
eGFR: 65 mL/min/{1.73_m2} (ref >59)

## 2022-04-05 LAB — LIPID PANEL WITH LDL/HDL RATIO
Cholesterol, Total: 243 mg/dL — ABNORMAL HIGH (ref 100–199)
HDL: 78 mg/dL (ref >39)
LDL Chol Calc (NIH): 140 mg/dL — ABNORMAL HIGH (ref 0–99)
LDL/HDL Ratio: 1.8 ratio (ref 0.0–3.2)
Triglycerides: 144 mg/dL (ref 0–149)
VLDL Cholesterol Cal: 25 mg/dL (ref 5–40)

## 2022-04-05 LAB — HEMOGLOBIN A1C
Est. average glucose Bld gHb Est-mCnc: 151 mg/dL
Hgb A1c MFr Bld: 6.9 % — ABNORMAL HIGH (ref 4.8–5.6)

## 2022-06-01 ENCOUNTER — Other Ambulatory Visit: Payer: Self-pay | Admitting: Family Medicine

## 2022-06-01 DIAGNOSIS — I10 Essential (primary) hypertension: Secondary | ICD-10-CM

## 2022-07-15 LAB — HM DIABETES EYE EXAM

## 2022-07-30 ENCOUNTER — Ambulatory Visit: Payer: Managed Care, Other (non HMO) | Admitting: Family Medicine

## 2022-08-04 ENCOUNTER — Encounter: Payer: Self-pay | Admitting: Family Medicine

## 2022-10-13 ENCOUNTER — Other Ambulatory Visit: Payer: Self-pay

## 2022-10-13 MED ORDER — TRADJENTA 5 MG PO TABS: 5.0000 mg | ORAL_TABLET | Freq: Every day | ORAL | 0 refills | Status: DC

## 2022-10-13 MED ORDER — METFORMIN HCL ER 500 MG PO TB24: 500.0000 mg | ORAL_TABLET | Freq: Every day | ORAL | 0 refills | Status: DC

## 2022-10-21 ENCOUNTER — Ambulatory Visit: Payer: Managed Care, Other (non HMO) | Admitting: Family Medicine

## 2022-10-21 ENCOUNTER — Encounter: Payer: Self-pay | Admitting: Family Medicine

## 2022-10-21 VITALS — BP 128/78 | HR 93 | Ht 62.0 in | Wt 174.0 lb

## 2022-10-21 DIAGNOSIS — J301 Allergic rhinitis due to pollen: Secondary | ICD-10-CM

## 2022-10-21 DIAGNOSIS — Z23 Encounter for immunization: Secondary | ICD-10-CM | POA: Diagnosis not present

## 2022-10-21 DIAGNOSIS — E1169 Type 2 diabetes mellitus with other specified complication: Secondary | ICD-10-CM | POA: Diagnosis not present

## 2022-10-21 DIAGNOSIS — I10 Essential (primary) hypertension: Secondary | ICD-10-CM

## 2022-10-21 DIAGNOSIS — Z1211 Encounter for screening for malignant neoplasm of colon: Secondary | ICD-10-CM

## 2022-10-21 MED ORDER — FLUTICASONE PROPIONATE 50 MCG/ACT NA SUSP: 2.0000 | Freq: Every day | NASAL | 6 refills | Status: DC

## 2022-10-21 MED ORDER — TRADJENTA 5 MG PO TABS: 5.0000 mg | ORAL_TABLET | Freq: Every day | ORAL | 5 refills | Status: DC

## 2022-10-21 MED ORDER — METFORMIN HCL ER 500 MG PO TB24: 500.0000 mg | ORAL_TABLET | Freq: Every day | ORAL | 1 refills | Status: DC

## 2022-10-21 MED ORDER — LOSARTAN POTASSIUM-HCTZ 100-25 MG PO TABS: 1.0000 | ORAL_TABLET | Freq: Every day | ORAL | 1 refills | Status: DC

## 2022-10-21 NOTE — Progress Notes (Signed)
Date:  10/21/2022   Name:  Pamela Solomon   DOB:  04/25/1966   MRN:  [id number]   Chief Complaint: Diabetes, Hypertension, Allergic Rhinitis , and Flu Vaccine  Diabetes She presents for her follow-up diabetic visit. She has type 2 diabetes mellitus. Her disease course has been stable. There are no hypoglycemic associated symptoms. Pertinent negatives for hypoglycemia include no dizziness, headaches or nervousness/anxiousness. There are no diabetic associated symptoms. Pertinent negatives for diabetes include no blurred vision, no chest pain, no fatigue and no weakness. There are no hypoglycemic complications. Symptoms are stable. There are no diabetic complications. Pertinent negatives for diabetic complications include no CVA, PVD or retinopathy. There are no known risk factors for coronary artery disease. Current diabetic treatment includes oral agent (dual therapy). Her weight is stable. She is following a generally healthy diet. She participates in exercise daily. An ACE inhibitor/angiotensin II receptor blocker is being taken.  Hypertension This is a chronic problem. The problem is controlled. Pertinent negatives include no blurred vision, chest pain, headaches, orthopnea, palpitations, PND or shortness of breath. There are no associated agents to hypertension. Risk factors for coronary artery disease include diabetes mellitus. Past treatments include angiotensin blockers and diuretics. There are no compliance problems.  There is no history of angina, kidney disease, CAD/MI, CVA, heart failure, left ventricular hypertrophy, PVD or retinopathy. There is no history of chronic renal disease, a hypertension causing med or renovascular disease.    Lab Results  Component Value Date   NA 143 04/04/2022   K 3.9 04/04/2022   CO2 24 04/04/2022   GLUCOSE 92 04/04/2022   BUN 20 04/04/2022   CREATININE 1.01 (H) 04/04/2022   CALCIUM 10.5 (H) 04/04/2022   EGFR 65 04/04/2022   GFRNONAA 62 08/28/2017    Lab Results  Component Value Date   CHOL 243 (H) 04/04/2022   HDL 78 04/04/2022   LDLCALC 140 (H) 04/04/2022   TRIG 144 04/04/2022   CHOLHDL 2.7 08/28/2017   Lab Results  Component Value Date   TSH 3.500 07/27/2017   Lab Results  Component Value Date   HGBA1C 6.9 (H) 04/04/2022   Lab Results  Component Value Date   WBC 9.1 07/27/2017   HGB 14.0 07/27/2017   HCT 42.4 07/27/2017   MCV 80 07/27/2017   PLT 268 07/27/2017   No results found for: "ALT", "AST", "GGT", "ALKPHOS", "BILITOT" No results found for: "25OHVITD2", "25OHVITD3", "VD25OH"   Review of Systems  Constitutional: Negative.  Negative for chills, fatigue, fever and unexpected weight change.  HENT:  Negative for congestion, ear discharge, ear pain, rhinorrhea, sinus pressure, sneezing and sore throat.   Eyes:  Negative for blurred vision.  Respiratory:  Negative for cough, shortness of breath, wheezing and stridor.   Cardiovascular:  Negative for chest pain, palpitations, orthopnea and PND.  Gastrointestinal:  Negative for abdominal pain, blood in stool, constipation, diarrhea and nausea.  Genitourinary:  Negative for dysuria, flank pain, frequency, hematuria, urgency and vaginal discharge.  Musculoskeletal:  Negative for arthralgias, back pain and myalgias.  Skin:  Negative for rash.  Neurological:  Negative for dizziness, weakness and headaches.  Hematological:  Negative for adenopathy. Does not bruise/bleed easily.  Psychiatric/Behavioral:  Negative for dysphoric mood. The patient is not nervous/anxious.     There are no problems to display for this patient.   No Known Allergies  Past Surgical History:  Procedure Laterality Date   ABDOMINAL HYSTERECTOMY      Social History  Tobacco Use   Smoking status: Never   Smokeless tobacco: Never  Substance Use Topics   Alcohol use: No   Drug use: No     Medication list has been reviewed and updated.  Current Meds  Medication Sig   carbamide  peroxide (DEBROX) 6.5 % OTIC solution Place 5 drops into both ears 2 (two) times daily.   fluticasone (FLONASE) 50 MCG/ACT nasal spray Place 2 sprays into both nostrils daily.   losartan-hydrochlorothiazide (HYZAAR) 100-25 MG tablet TAKE 1 TABLET BY MOUTH DAILY   metFORMIN (GLUCOPHAGE-XR) 500 MG 24 hr tablet Take 1 tablet (500 mg total) by mouth daily.   TRADJENTA 5 MG TABS tablet Take 1 tablet (5 mg total) by mouth daily.       10/21/2022    2:16 PM 04/04/2022    2:14 PM 07/29/2021   10:57 AM 07/15/2021    2:05 PM  GAD 7 : Generalized Anxiety Score  Nervous, Anxious, on Edge 0 1 0 0  Control/stop worrying 0 1 0 0  Worry too much - different things 0 1 0 0  Trouble relaxing 0 0 0 0  Restless 0 0 0 0  Easily annoyed or irritable 0 0 0 0  Afraid - awful might happen 0 0 0 0  Total GAD 7 Score 0 3 0 0  Anxiety Difficulty Not difficult at all Not difficult at all         10/21/2022    2:16 PM 04/04/2022    2:14 PM 07/15/2021    2:05 PM  Depression screen PHQ 2/9  Decreased Interest 0 1 0  Down, Depressed, Hopeless 0 1 0  PHQ - 2 Score 0 2 0  Altered sleeping 0 1 0  Tired, decreased energy 0 1 0  Change in appetite 0 0 0  Feeling bad or failure about yourself  0 0 0  Trouble concentrating 0 1 0  Moving slowly or fidgety/restless 0 0 0  Suicidal thoughts 0 0 0  PHQ-9 Score 0 5 0  Difficult doing work/chores Not difficult at all Not difficult at all     BP Readings from Last 3 Encounters:  10/21/22 128/78  04/04/22 120/80  11/29/21 120/70    Physical Exam Vitals and nursing note reviewed. Exam conducted with a chaperone present.  Constitutional:      General: She is not in acute distress.    Appearance: She is not diaphoretic.  HENT:     Head: Normocephalic and atraumatic.     Right Ear: External ear normal.     Left Ear: External ear normal.     Nose: Nose normal.  Eyes:     General:        Right eye: No discharge.        Left eye: No discharge.      Conjunctiva/sclera: Conjunctivae normal.     Pupils: Pupils are equal, round, and reactive to light.  Neck:     Thyroid: No thyromegaly.     Vascular: No JVD.  Cardiovascular:     Rate and Rhythm: Normal rate and regular rhythm.     Heart sounds: Normal heart sounds, S1 normal and S2 normal. No murmur heard.    No systolic murmur is present.     No diastolic murmur is present.     No friction rub. No gallop. No S3 or S4 sounds.  Pulmonary:     Effort: Pulmonary effort is normal.     Breath sounds: Normal breath  sounds. No wheezing or rhonchi.  Abdominal:     General: Bowel sounds are normal.     Palpations: Abdomen is soft. There is no mass.     Tenderness: There is no abdominal tenderness. There is no guarding.  Musculoskeletal:        General: Normal range of motion.     Cervical back: Neck supple.  Lymphadenopathy:     Cervical: No cervical adenopathy.  Skin:    General: Skin is warm and dry.  Neurological:     Mental Status: She is alert.     Wt Readings from Last 3 Encounters:  10/21/22 174 lb (78.9 kg)  04/04/22 170 lb (77.1 kg)  11/29/21 153 lb (69.4 kg)    BP 128/78   Pulse 93   Ht _0  (1.575 m)   Wt 174 lb (78.9 kg)   SpO2 97%   BMI 31.83 kg/m   Assessment and Plan:  1. Type 2 diabetes mellitus with other specified complication, unspecified whether long term insulin use (HCC) Chronic.  Controlled.  Stable.  Continue metformin XR 500 mg once a day and Tradjenta 5 mg once a day.  Will check CMP and A1c as well as microalbuminuria with creatinine urine ratio.  We will recheck patient in 4 months. - metFORMIN (GLUCOPHAGE-XR) 500 MG 24 hr tablet; Take 1 tablet (500 mg total) by mouth daily.  Dispense: 90 tablet; Refill: 1 - TRADJENTA 5 MG TABS tablet; Take 1 tablet (5 mg total) by mouth daily.  Dispense: 30 tablet; Refill: 5 - Comprehensive Metabolic Panel (CMET) - HgB A1c - Microalbumin / creatinine urine ratio  2. Primary hypertension Chronic.   Controlled.  Stable.  Continue losartan hydrochlorothiazide 100-25 mg once a day.  Will check CMP for electrolytes and GFR. - losartan-hydrochlorothiazide (HYZAAR) 100-25 MG tablet; Take 1 tablet by mouth daily.  Dispense: 90 tablet; Refill: 1 - Comprehensive Metabolic Panel (CMET)  3. Seasonal allergic rhinitis due to pollen Chronic.  Controlled.  Stable.  More so dependent on pollen exposure.  Patient is doing well with Flonase as well as over-the-counter antihistamines of a nonsedating variety. - fluticasone (FLONASE) 50 MCG/ACT nasal spray; Place 2 sprays into both nostrils daily.  Dispense: 16 g; Refill: 6  4. Colon cancer screening Discussed with patient and referral placed with gastroenterology - Ambulatory referral to Gastroenterology   5.  Influenza vaccine given/discussed and administered Otilio Miu, MD

## 2022-10-22 ENCOUNTER — Other Ambulatory Visit: Payer: Self-pay

## 2022-10-22 ENCOUNTER — Telehealth: Payer: Self-pay

## 2022-10-22 DIAGNOSIS — Z1211 Encounter for screening for malignant neoplasm of colon: Secondary | ICD-10-CM

## 2022-10-22 MED ORDER — NA SULFATE-K SULFATE-MG SULF 17.5-3.13-1.6 GM/177ML PO SOLN: 1.0000 | Freq: Once | ORAL | 0 refills | Status: AC

## 2022-10-22 NOTE — Telephone Encounter (Signed)
Gastroenterology Pre-Procedure Review  Request Date: 12/15/22 Requesting Physician: Dr. Servando Snare  PATIENT REVIEW QUESTIONS: The patient responded to the following health history questions as indicated:    1. Are you having any GI issues? no 2. Do you have a personal history of Polyps? no 3. Do you have a family history of Colon Cancer or Polyps? no 4. Diabetes Mellitus? yes (Stop Tradjenta 12/14/22.  Stop Metformin 12/13/22) 5. Joint replacements in the past 12 months?no 6. Major health problems in the past 3 months?no 7. Any artificial heart valves, MVP, or defibrillator?no    MEDICATIONS & ALLERGIES:    Patient reports the following regarding taking any anticoagulation/antiplatelet therapy:   Plavix, Coumadin, Eliquis, Xarelto, Lovenox, Pradaxa, Brilinta, or Effient? no Aspirin? no  Patient confirms/reports the following medications:  Current Outpatient Medications  Medication Sig Dispense Refill   carbamide peroxide (DEBROX) 6.5 % OTIC solution Place 5 drops into both ears 2 (two) times daily. 15 mL 0   fluticasone (FLONASE) 50 MCG/ACT nasal spray Place 2 sprays into both nostrils daily. 16 g 11   fluticasone (FLONASE) 50 MCG/ACT nasal spray Place 2 sprays into both nostrils daily. 16 g 6   losartan-hydrochlorothiazide (HYZAAR) 100-25 MG tablet Take 1 tablet by mouth daily. 90 tablet 1   metFORMIN (GLUCOPHAGE-XR) 500 MG 24 hr tablet Take 1 tablet (500 mg total) by mouth daily. 90 tablet 1   TRADJENTA 5 MG TABS tablet Take 1 tablet (5 mg total) by mouth daily. 30 tablet 5   No current facility-administered medications for this visit.    Patient confirms/reports the following allergies:  No Known Allergies  No orders of the defined types were placed in this encounter.   AUTHORIZATION INFORMATION Primary Insurance: 1D#: Group #:  Secondary Insurance: 1D#: Group #:  SCHEDULE INFORMATION: Date: 12/15/22 Time: Location: [facility]

## 2022-10-24 LAB — COMPREHENSIVE METABOLIC PANEL
ALT: 18 IU/L (ref 0–32)
AST: 15 IU/L (ref 0–40)
Albumin/Globulin Ratio: 1.7 (ref 1.2–2.2)
Albumin: 4.5 g/dL (ref 3.8–4.9)
Alkaline Phosphatase: 98 IU/L (ref 44–121)
BUN/Creatinine Ratio: 11 (ref 9–23)
BUN: 14 mg/dL (ref 6–24)
Bilirubin Total: 0.4 mg/dL (ref 0.0–1.2)
CO2: 26 mmol/L (ref 20–29)
Calcium: 10.1 mg/dL (ref 8.7–10.2)
Chloride: 97 mmol/L (ref 96–106)
Creatinine, Ser: 1.28 mg/dL — ABNORMAL HIGH (ref 0.57–1.00)
Globulin, Total: 2.7 g/dL (ref 1.5–4.5)
Glucose: 96 mg/dL (ref 70–99)
Potassium: 4.1 mmol/L (ref 3.5–5.2)
Sodium: 139 mmol/L (ref 134–144)
Total Protein: 7.2 g/dL (ref 6.0–8.5)
eGFR: 49 mL/min/{1.73_m2} — ABNORMAL LOW (ref >59)

## 2022-10-24 LAB — MICROALBUMIN / CREATININE URINE RATIO
Creatinine, Urine: 93.3 mg/dL
Microalb/Creat Ratio: <3 mg/g creat (ref 0–29)
Microalbumin, Urine: <3 ug/mL

## 2022-10-24 LAB — HEMOGLOBIN A1C
Est. average glucose Bld gHb Est-mCnc: 171 mg/dL
Hgb A1c MFr Bld: 7.6 % — ABNORMAL HIGH (ref 4.8–5.6)

## 2022-12-02 ENCOUNTER — Other Ambulatory Visit: Payer: Self-pay

## 2022-12-02 ENCOUNTER — Encounter: Payer: Self-pay | Admitting: Gastroenterology

## 2022-12-15 ENCOUNTER — Ambulatory Visit
Admission: RE | Admit: 2022-12-15 | Discharge: 2022-12-15 | Disposition: A | Discharge status: Home / Self Care | Payer: Managed Care, Other (non HMO) | Attending: Gastroenterology | Admitting: Gastroenterology

## 2022-12-15 ENCOUNTER — Ambulatory Visit: Payer: Managed Care, Other (non HMO) | Admitting: Anesthesiology

## 2022-12-15 ENCOUNTER — Encounter
Admission: RE | Disposition: A | Discharge status: Home / Self Care | Payer: Self-pay | Source: Home / Self Care | Attending: Gastroenterology

## 2022-12-15 ENCOUNTER — Other Ambulatory Visit: Payer: Self-pay

## 2022-12-15 ENCOUNTER — Encounter: Payer: Self-pay | Admitting: Gastroenterology

## 2022-12-15 DIAGNOSIS — I10 Essential (primary) hypertension: Secondary | ICD-10-CM | POA: Insufficient documentation

## 2022-12-15 DIAGNOSIS — Z7984 Long term (current) use of oral hypoglycemic drugs: Secondary | ICD-10-CM | POA: Diagnosis not present

## 2022-12-15 DIAGNOSIS — Z1211 Encounter for screening for malignant neoplasm of colon: Secondary | ICD-10-CM

## 2022-12-15 DIAGNOSIS — E119 Type 2 diabetes mellitus without complications: Secondary | ICD-10-CM | POA: Diagnosis not present

## 2022-12-15 DIAGNOSIS — Z79899 Other long term (current) drug therapy: Secondary | ICD-10-CM | POA: Insufficient documentation

## 2022-12-15 DIAGNOSIS — K635 Polyp of colon: Secondary | ICD-10-CM | POA: Diagnosis not present

## 2022-12-15 HISTORY — PX: COLONOSCOPY WITH PROPOFOL: SHX5780

## 2022-12-15 HISTORY — DX: Essential (primary) hypertension: I10

## 2022-12-15 HISTORY — DX: Type 2 diabetes mellitus without complications: E11.9

## 2022-12-15 LAB — GLUCOSE, CAPILLARY: Glucose-Capillary: 184 mg/dL — ABNORMAL HIGH (ref 70–99)

## 2022-12-15 SURGERY — COLONOSCOPY WITH PROPOFOL
Anesthesia: General

## 2022-12-15 MED ORDER — STERILE WATER FOR IRRIGATION IR SOLN
Status: DC | PRN
  Administered 2022-12-15: 1000 mL

## 2022-12-15 MED ORDER — LACTATED RINGERS IV SOLN: INTRAVENOUS | Status: DC

## 2022-12-15 MED ORDER — LIDOCAINE HCL (CARDIAC) PF 100 MG/5ML IV SOSY
PREFILLED_SYRINGE | INTRAVENOUS | Status: DC | PRN
  Administered 2022-12-15: 40 mg via INTRAVENOUS

## 2022-12-15 MED ORDER — SODIUM CHLORIDE 0.9 % IV SOLN: INTRAVENOUS | Status: DC

## 2022-12-15 MED ORDER — PROPOFOL 10 MG/ML IV BOLUS
INTRAVENOUS | Status: DC | PRN
  Administered 2022-12-15: 50 mg via INTRAVENOUS
  Administered 2022-12-15: 100 mg via INTRAVENOUS
  Administered 2022-12-15: 30 mg via INTRAVENOUS

## 2022-12-15 SURGICAL SUPPLY — 21 items

## 2022-12-15 NOTE — Transfer of Care (Signed)
Immediate Anesthesia Transfer of Care Note  Patient: Pamela Solomon  Procedure(s) Performed: COLONOSCOPY WITH PROPOFOL WITH POLYPECTOMY  Patient Location: PACU  Anesthesia Type: General  Level of Consciousness: awake, alert  and patient cooperative  Airway and Oxygen Therapy: Patient Spontanous Breathing and Patient connected to supplemental oxygen  Post-op Assessment: Post-op Vital signs reviewed, Patient's Cardiovascular Status Stable, Respiratory Function Stable, Patent Airway and No signs of Nausea or vomiting  Post-op Vital Signs: Reviewed and stable  Complications: No notable events documented.

## 2022-12-15 NOTE — H&P (Signed)
Lucilla Lame, MD [address] [place], [place] [address] Phone: [phone] Fax : [phone]  Primary Care Physician:  Juline Patch, MD Primary Gastroenterologist:  Dr. Allen Norris  Pre-Procedure History & Physical: HPI:  Pamela Solomon is a 57 y.o. female is here for a screening colonoscopy.   Past Medical History:  Diagnosis Date   Diabetes mellitus without complication ([place])    Type 2   Hypertension     Past Surgical History:  Procedure Laterality Date   ABDOMINAL HYSTERECTOMY      Prior to Admission medications   Medication Sig Start Date End Date Taking? Authorizing Provider  fluticasone (FLONASE) 50 MCG/ACT nasal spray Place 2 sprays into both nostrils daily. 10/21/22  Yes Juline Patch, MD  carbamide peroxide (DEBROX) 6.5 % OTIC solution Place 5 drops into both ears 2 (two) times daily. 04/11/21   Juline Patch, MD  fluticasone (FLONASE) 50 MCG/ACT nasal spray Place 2 sprays into both nostrils daily. Patient taking differently: Place 2 sprays into both nostrils as needed. 04/11/21   Juline Patch, MD  losartan-hydrochlorothiazide (HYZAAR) 100-25 MG tablet Take 1 tablet by mouth daily. 10/21/22   Juline Patch, MD  metFORMIN (GLUCOPHAGE-XR) 500 MG 24 hr tablet Take 1 tablet (500 mg total) by mouth daily. 10/21/22 10/21/23  Juline Patch, MD  TRADJENTA 5 MG TABS tablet Take 1 tablet (5 mg total) by mouth daily. 10/21/22   Juline Patch, MD    Allergies as of 10/22/2022   (No Known Allergies)    Family History  Problem Relation Age of Onset   Diabetes Father    Diabetes Sister    Cancer Maternal Grandfather    Breast cancer Cousin        pat cousin    Social History   Socioeconomic History   Marital status: Married    Spouse name: Not on file   Number of children: Not on file   Years of education: Not on file   Highest education level: Not on file  Occupational History   Not on file  Tobacco Use   Smoking status: Never   Smokeless  tobacco: Never  Substance and Sexual Activity   Alcohol use: No   Drug use: No   Sexual activity: Yes  Other Topics Concern   Not on file  Social History Narrative   Not on file   Social Determinants of Health   Financial Resource Strain: Not on file  Food Insecurity: Not on file  Transportation Needs: Not on file  Physical Activity: Not on file  Stress: Not on file  Social Connections: Not on file  Intimate Partner Violence: Not on file    Review of Systems: See HPI, otherwise negative ROS  Physical Exam: BP 134/66   Pulse 71   Temp 99.2 F (37.3 C) (Temporal)   Resp 18   Ht 5\' 4"  (1.626 m)   Wt 79.8 kg   SpO2 98%   BMI 30.21 kg/m  General:   Alert,  pleasant and cooperative in NAD Head:  Normocephalic and atraumatic. Neck:  Supple; no masses or thyromegaly. Lungs:  Clear throughout to auscultation.    Heart:  Regular rate and rhythm. Abdomen:  Soft, nontender and nondistended. Normal bowel sounds, without guarding, and without rebound.   Neurologic:  Alert and  oriented x4;  grossly normal neurologically.  Impression/Plan: Pamela Solomon is now here to undergo a screening colonoscopy.  Risks, benefits, and alternatives regarding  colonoscopy have been reviewed with the patient.  Questions have been answered.  All parties agreeable.

## 2022-12-15 NOTE — Op Note (Signed)
[facility] Patient Name: Pamela Solomon Procedure Date: 12/15/2022 8:15 AM MRN: [id number] Account #: [id number] Date of Birth: [birth year] Admit Type: Outpatient Age: 57 Room: [facility] OR ROOM 01 Gender: Female Note Status: Finalized Instrument Name: [id number] Procedure:             Colonoscopy Indications:           Screening for colorectal malignant neoplasm Providers:             Lucilla Lame MD, MD Referring MD:          Juline Patch, MD (Referring MD) Medicines:             Propofol per Anesthesia Complications:         No immediate complications. Procedure:             Pre-Anesthesia Assessment:                        - Prior to the procedure, a History and Physical was                         performed, and patient medications and allergies were                         reviewed. The patient's tolerance of previous                         anesthesia was also reviewed. The risks and benefits                         of the procedure and the sedation options and risks                         were discussed with the patient. All questions were                         answered, and informed consent was obtained. Prior                         Anticoagulants: The patient has taken no anticoagulant                         or antiplatelet agents. ASA Grade Assessment: II - A                         patient with mild systemic disease. After reviewing                         the risks and benefits, the patient was deemed in                         satisfactory condition to undergo the procedure.                        After obtaining informed consent, the colonoscope was                         passed under direct vision. Throughout the procedure,  the patient's blood pressure, pulse, and oxygen                         saturations were monitored continuously. The                         Colonoscope was introduced through the anus and                          advanced to the the cecum, identified by appendiceal                         orifice and ileocecal valve. The colonoscopy was                         performed without difficulty. The patient tolerated                         the procedure well. The quality of the bowel                         preparation was excellent. Findings:      The perianal and digital rectal examinations were normal.      A 3 mm polyp was found in the transverse colon. The polyp was sessile.       The polyp was removed with a cold biopsy forceps. Resection and       retrieval were complete.      A 7 mm polyp was found in the sigmoid colon. The polyp was sessile. The       polyp was removed with a cold snare. Resection and retrieval were       complete. Impression:            - One 3 mm polyp in the transverse colon, removed with                         a cold biopsy forceps. Resected and retrieved.                        - One 7 mm polyp in the sigmoid colon, removed with a                         cold snare. Resected and retrieved. Recommendation:        - Discharge patient to home.                        - Resume previous diet.                        - Continue present medications.                        - Await pathology results.                        - If the pathology report reveals adenomatous tissue,                         then repeat the colonoscopy for surveillance in 7  years. Procedure Code(s):     --- Professional ---                        [phone], Colonoscopy, flexible; with removal of                         tumor(s), polyp(s), or other lesion(s) by snare                         technique                        45380, 34, Colonoscopy, flexible; with biopsy, single                         or multiple Diagnosis Code(s):     --- Professional ---                        Z12.11, Encounter for screening for malignant neoplasm                         of  colon                        D12.3, Benign neoplasm of transverse colon (hepatic                         flexure or splenic flexure) CPT copyright 2022 American Medical Association. All rights reserved. The codes documented in this report are preliminary and upon coder review may  be revised to meet current compliance requirements. Lucilla Lame MD, MD 12/15/2022 8:39:17 AM This report has been signed electronically. Number of Addenda: 0 Note Initiated On: 12/15/2022 8:15 AM Scope Withdrawal Time: 0 hours 9 minutes 6 seconds  Total Procedure Duration: 0 hours 11 minutes 21 seconds  Estimated Blood Loss:  Estimated blood loss: none.      [facility]

## 2022-12-15 NOTE — Anesthesia Preprocedure Evaluation (Signed)
Anesthesia Evaluation  Patient identified by MRN, date of birth, ID band Patient awake    Reviewed: Allergy & Precautions, H&P , NPO status , Patient's Chart, lab work & pertinent test results, reviewed documented beta blocker date and time   History of Anesthesia Complications Negative for: history of anesthetic complications  Airway Mallampati: I  TM Distance: >3 FB Neck ROM: full    Dental  (+) Dental Advidsory Given, Teeth Intact, Missing   Pulmonary neg pulmonary ROS   Pulmonary exam normal breath sounds clear to auscultation       Cardiovascular Exercise Tolerance: Good hypertension, (-) angina (-) Past MI and (-) Cardiac Stents Normal cardiovascular exam(-) dysrhythmias (-) Valvular Problems/Murmurs Rhythm:regular Rate:Normal     Neuro/Psych negative neurological ROS  negative psych ROS   GI/Hepatic negative GI ROS, Neg liver ROS,,,  Endo/Other  diabetes    Renal/GU negative Renal ROS  negative genitourinary   Musculoskeletal   Abdominal   Peds  Hematology negative hematology ROS (+)   Anesthesia Other Findings Past Medical History: No date: Diabetes mellitus without complication (HCC)     Comment:  Type 2 No date: Hypertension   Reproductive/Obstetrics negative OB ROS                             Anesthesia Physical Anesthesia Plan  ASA: 2  Anesthesia Plan: General   Post-op Pain Management:    Induction: Intravenous  PONV Risk Score and Plan: 3 and Propofol infusion, TIVA and Treatment may vary due to age or medical condition  Airway Management Planned: Natural Airway and Nasal Cannula  Additional Equipment:   Intra-op Plan:   Post-operative Plan:   Informed Consent: I have reviewed the patients History and Physical, chart, labs and discussed the procedure including the risks, benefits and alternatives for the proposed anesthesia with the patient or authorized  representative who has indicated his/her understanding and acceptance.     Dental Advisory Given  Plan Discussed with: Anesthesiologist, CRNA and Surgeon  Anesthesia Plan Comments:        Anesthesia Quick Evaluation

## 2022-12-15 NOTE — Anesthesia Postprocedure Evaluation (Signed)
Anesthesia Post Note  Patient: Pamela Solomon  Procedure(s) Performed: COLONOSCOPY WITH PROPOFOL WITH POLYPECTOMY  Patient location during evaluation: Endoscopy Anesthesia Type: General Level of consciousness: awake and alert Pain management: pain level controlled Vital Signs Assessment: post-procedure vital signs reviewed and stable Respiratory status: spontaneous breathing, nonlabored ventilation, respiratory function stable and patient connected to nasal cannula oxygen Cardiovascular status: blood pressure returned to baseline and stable Postop Assessment: no apparent nausea or vomiting Anesthetic complications: no   No notable events documented.   Last Vitals:  Vitals:   12/15/22 0840 12/15/22 0845  BP: 110/75 112/71  Pulse: 87 77  Resp: 14 (!) 21  Temp: (!) 36.4 C (!) 36.4 C  SpO2: 96% 97%    Last Pain:  Vitals:   12/15/22 0845  TempSrc:   PainSc: 0-No pain                 Martha Clan

## 2022-12-16 ENCOUNTER — Encounter: Payer: Self-pay | Admitting: Gastroenterology

## 2022-12-17 LAB — SURGICAL PATHOLOGY

## 2022-12-18 ENCOUNTER — Encounter: Payer: Self-pay | Admitting: Gastroenterology

## 2023-01-28 ENCOUNTER — Ambulatory Visit: Payer: Managed Care, Other (non HMO) | Admitting: Family Medicine

## 2023-05-09 ENCOUNTER — Other Ambulatory Visit: Payer: Self-pay | Admitting: Family Medicine

## 2023-05-09 DIAGNOSIS — E1169 Type 2 diabetes mellitus with other specified complication: Secondary | ICD-10-CM

## 2023-07-01 ENCOUNTER — Telehealth: Payer: Self-pay | Admitting: Family Medicine

## 2023-07-01 NOTE — Telephone Encounter (Signed)
Copied from CRM [id number]. Topic: General - Other >> Jul 01, 2023 10:45 AM Franchot Heidelberg wrote: Reason for CRM: call back request for Delice Bison, questions about a specialist that was recently discussed  [phone]

## 2023-07-02 ENCOUNTER — Ambulatory Visit (INDEPENDENT_AMBULATORY_CARE_PROVIDER_SITE_OTHER): Payer: Managed Care, Other (non HMO) | Admitting: Family Medicine

## 2023-07-02 ENCOUNTER — Encounter: Payer: Self-pay | Admitting: Family Medicine

## 2023-07-02 VITALS — BP 118/82 | HR 88 | Ht 64.0 in | Wt 178.0 lb

## 2023-07-02 DIAGNOSIS — E7801 Familial hypercholesterolemia: Secondary | ICD-10-CM

## 2023-07-02 DIAGNOSIS — E119 Type 2 diabetes mellitus without complications: Secondary | ICD-10-CM | POA: Diagnosis not present

## 2023-07-02 DIAGNOSIS — Z23 Encounter for immunization: Secondary | ICD-10-CM | POA: Diagnosis not present

## 2023-07-02 DIAGNOSIS — Z1239 Encounter for other screening for malignant neoplasm of breast: Secondary | ICD-10-CM | POA: Diagnosis not present

## 2023-07-02 DIAGNOSIS — I10 Essential (primary) hypertension: Secondary | ICD-10-CM

## 2023-07-02 DIAGNOSIS — Z7984 Long term (current) use of oral hypoglycemic drugs: Secondary | ICD-10-CM

## 2023-07-02 MED ORDER — METFORMIN HCL ER 500 MG PO TB24: 500.0000 mg | ORAL_TABLET | Freq: Every day | ORAL | 1 refills | Status: DC

## 2023-07-02 MED ORDER — LOSARTAN POTASSIUM-HCTZ 100-25 MG PO TABS: 1.0000 | ORAL_TABLET | Freq: Every day | ORAL | 1 refills | Status: DC

## 2023-07-02 MED ORDER — TRADJENTA 5 MG PO TABS: 5.0000 mg | ORAL_TABLET | Freq: Every day | ORAL | 1 refills | Status: DC

## 2023-07-02 NOTE — Progress Notes (Signed)
Date:  07/02/2023   Name:  Pamela Solomon   DOB:  18-Dec-1965   MRN:  [id number]   Chief Complaint: Hypertension and Diabetes (Out of Tradjenta x 2 weeks)  Hypertension This is a chronic problem. The current episode started more than 1 year ago. The problem has been waxing and waning since onset. The problem is controlled. Pertinent negatives include no anxiety, blurred vision, chest pain, headaches, malaise/fatigue, neck pain, orthopnea, palpitations, peripheral edema, PND, shortness of breath or sweats. There are no associated agents to hypertension. Risk factors for coronary artery disease include dyslipidemia. Past treatments include angiotensin blockers and diuretics. The current treatment provides moderate improvement. There are no compliance problems.  There is no history of angina, CAD/MI or CVA. There is no history of chronic renal disease, a hypertension causing med or renovascular disease.  Diabetes She presents for her follow-up diabetic visit. She has type 2 diabetes mellitus. Her disease course has been stable. Pertinent negatives for hypoglycemia include no confusion, headaches, seizures, sleepiness or sweats. Pertinent negatives for diabetes include no blurred vision, no chest pain, no fatigue, no foot paresthesias, no polydipsia, no polyuria, no visual change and no weight loss. There are no hypoglycemic complications. Symptoms are worsening. There are no diabetic complications. Pertinent negatives for diabetic complications include no CVA. There are no known risk factors for coronary artery disease. Current diabetic treatment includes oral agent (monotherapy). She is compliant with treatment some of the time. She is following a generally healthy diet. Meal planning includes avoidance of concentrated sweets and carbohydrate counting. An ACE inhibitor/angiotensin II receptor blocker is being taken.    Lab Results  Component Value Date   NA 139 10/22/2022   K 4.1 10/22/2022   CO2  26 10/22/2022   GLUCOSE 96 10/22/2022   BUN 14 10/22/2022   CREATININE 1.28 (H) 10/22/2022   CALCIUM 10.1 10/22/2022   EGFR 49 (L) 10/22/2022   GFRNONAA 62 08/28/2017   Lab Results  Component Value Date   CHOL 243 (H) 04/04/2022   HDL 78 04/04/2022   LDLCALC 140 (H) 04/04/2022   TRIG 144 04/04/2022   CHOLHDL 2.7 08/28/2017   Lab Results  Component Value Date   TSH 3.500 07/27/2017   Lab Results  Component Value Date   HGBA1C 7.6 (H) 10/22/2022   Lab Results  Component Value Date   WBC 9.1 07/27/2017   HGB 14.0 07/27/2017   HCT 42.4 07/27/2017   MCV 80 07/27/2017   PLT 268 07/27/2017   Lab Results  Component Value Date   ALT 18 10/22/2022   AST 15 10/22/2022   ALKPHOS 98 10/22/2022   BILITOT 0.4 10/22/2022   No results found for: "25OHVITD2", "25OHVITD3", "VD25OH"   Review of Systems  Constitutional:  Negative for fatigue, malaise/fatigue and weight loss.  Eyes:  Negative for blurred vision.  Respiratory:  Negative for shortness of breath.   Cardiovascular:  Negative for chest pain, palpitations, orthopnea and PND.  Endocrine: Negative for polydipsia and polyuria.  Musculoskeletal:  Negative for neck pain.  Neurological:  Negative for seizures and headaches.  Psychiatric/Behavioral:  Negative for confusion.     Patient Active Problem List   Diagnosis Date Noted   Encounter for screening colonoscopy 12/15/2022   Polyp of sigmoid colon 12/15/2022    No Known Allergies  Past Surgical History:  Procedure Laterality Date   ABDOMINAL HYSTERECTOMY     COLONOSCOPY WITH PROPOFOL N/A 12/15/2022   Procedure: COLONOSCOPY WITH PROPOFOL WITH POLYPECTOMY;  Surgeon: Midge Minium, MD;  [facility];  Service: Endoscopy;  Laterality: N/A;    Social History   Tobacco Use   Smoking status: Never   Smokeless tobacco: Never  Substance Use Topics   Alcohol use: No   Drug use: No     Medication list has been reviewed and updated.  Current Meds   Medication Sig   carbamide peroxide (DEBROX) 6.5 % OTIC solution Place 5 drops into both ears 2 (two) times daily.   fluticasone (FLONASE) 50 MCG/ACT nasal spray Place 2 sprays into both nostrils daily. (Patient taking differently: Place 2 sprays into both nostrils as needed.)   fluticasone (FLONASE) 50 MCG/ACT nasal spray Place 2 sprays into both nostrils daily.   losartan-hydrochlorothiazide (HYZAAR) 100-25 MG tablet Take 1 tablet by mouth daily.   metFORMIN (GLUCOPHAGE-XR) 500 MG 24 hr tablet Take 1 tablet (500 mg total) by mouth daily.   TRADJENTA 5 MG TABS tablet Take 1 tablet (5 mg total) by mouth daily.       07/02/2023   10:23 AM 10/21/2022    2:16 PM 04/04/2022    2:14 PM 07/29/2021   10:57 AM  GAD 7 : Generalized Anxiety Score  Nervous, Anxious, on Edge 0 0 1 0  Control/stop worrying 0 0 1 0  Worry too much - different things 0 0 1 0  Trouble relaxing 0 0 0 0  Restless 0 0 0 0  Easily annoyed or irritable 0 0 0 0  Afraid - awful might happen 0 0 0 0  Total GAD 7 Score 0 0 3 0  Anxiety Difficulty Not difficult at all Not difficult at all Not difficult at all        07/02/2023   10:23 AM 10/21/2022    2:16 PM 04/04/2022    2:14 PM  Depression screen PHQ 2/9  Decreased Interest 0 0 1  Down, Depressed, Hopeless 0 0 1  PHQ - 2 Score 0 0 2  Altered sleeping 0 0 1  Tired, decreased energy 0 0 1  Change in appetite 0 0 0  Feeling bad or failure about yourself  0 0 0  Trouble concentrating 0 0 1  Moving slowly or fidgety/restless 0 0 0  Suicidal thoughts 0 0 0  PHQ-9 Score 0 0 5  Difficult doing work/chores Not difficult at all Not difficult at all Not difficult at all    BP Readings from Last 3 Encounters:  07/02/23 (!) 128/100  12/15/22 112/71  10/21/22 128/78    Physical Exam Vitals and nursing note reviewed. Exam conducted with a chaperone present.  Constitutional:      General: She is not in acute distress.    Appearance: She is not diaphoretic.  HENT:      Head: Normocephalic and atraumatic.     Right Ear: External ear normal.     Left Ear: External ear normal.     Nose: Nose normal.     Mouth/Throat:     Mouth: Mucous membranes are moist.  Eyes:     General:        Right eye: No discharge.        Left eye: No discharge.     Conjunctiva/sclera: Conjunctivae normal.     Pupils: Pupils are equal, round, and reactive to light.  Neck:     Thyroid: No thyromegaly.     Vascular: No JVD.  Cardiovascular:     Rate and Rhythm: Normal rate and regular rhythm.  Heart sounds: Normal heart sounds. No murmur heard.    No friction rub. No gallop.  Pulmonary:     Effort: Pulmonary effort is normal.     Breath sounds: Normal breath sounds. No wheezing, rhonchi or rales.  Chest:     Chest wall: No tenderness.  Breasts:    Right: Normal. No swelling, bleeding, inverted nipple, mass, nipple discharge, skin change or tenderness.     Left: Normal. No swelling, bleeding, inverted nipple, mass, nipple discharge, skin change or tenderness.  Abdominal:     General: Bowel sounds are normal.     Palpations: Abdomen is soft. There is no mass.     Tenderness: There is no abdominal tenderness. There is no guarding.  Musculoskeletal:        General: Normal range of motion.     Cervical back: Normal range of motion and neck supple.  Lymphadenopathy:     Cervical: No cervical adenopathy.     Upper Body:     Right upper body: No supraclavicular or axillary adenopathy.     Left upper body: No supraclavicular or axillary adenopathy.  Skin:    General: Skin is warm and dry.  Neurological:     Mental Status: She is alert.     Deep Tendon Reflexes: Reflexes are normal and symmetric.     Wt Readings from Last 3 Encounters:  07/02/23 178 lb (80.7 kg)  12/15/22 176 lb (79.8 kg)  10/21/22 174 lb (78.9 kg)    BP (!) 128/100 (BP Location: Right Arm, Cuff Size: Large)   Pulse 88   Ht 5\' 4"  (1.626 m)   Wt 178 lb (80.7 kg)   SpO2 98%   BMI 30.55 kg/m    Assessment and Plan: 1. Diabetes mellitus treated with oral medication (HCC) Chronic.  Controlled.  Stable.  Asymptomatic.  Tolerating medications well.  Patient has been out of her Tradjenta and we will continue with metformin XR 500 mg and resume Tradjenta 5 mg once a day.  Will hold on checking A1c and comprehensive metabolic panel until patient's had sufficient time for the Tradjenta to return to her system.  Will recheck in 4 months. - metFORMIN (GLUCOPHAGE-XR) 500 MG 24 hr tablet; Take 1 tablet (500 mg total) by mouth daily.  Dispense: 90 tablet; Refill: 1 - TRADJENTA 5 MG TABS tablet; Take 1 tablet (5 mg total) by mouth daily.  Dispense: 90 tablet; Refill: 1 - HgB A1c - Comprehensive Metabolic Panel (CMET)  2. Primary hypertension Chronic.  Controlled.  Stable.  Blood pressure 118/82.  Asymptomatic.  Tolerating medications well.  Continue losartan hydrochlorothiazide 100-25 mg once a day.  Will check CMP at a later date when we do her A1c. - losartan-hydrochlorothiazide (HYZAAR) 100-25 MG tablet; Take 1 tablet by mouth daily.  Dispense: 90 tablet; Refill: 1 - Comprehensive Metabolic Panel (CMET)  3. Familial hypercholesterolemia Chronic.  Controlled.  Stable.  Review of low glycemic and low-cholesterol low triglyceride dietary guidelines.  Will recheck lipid when patient is had a chance to institute this better in her dietary plan. - Lipid Panel With LDL/HDL Ratio  4. Encounter for breast cancer screening using non-mammogram modality Breast exam unremarkable with no palpable mass.  Will refer for screening mammogram. - MM 3D SCREENING MAMMOGRAM BILATERAL BREAST     Elizabeth Sauer, MD

## 2023-07-02 NOTE — Patient Instructions (Signed)

## 2023-07-07 ENCOUNTER — Telehealth: Payer: Self-pay

## 2023-07-07 NOTE — Telephone Encounter (Signed)
Called with needs labs drawn

## 2023-07-10 ENCOUNTER — Other Ambulatory Visit: Payer: Self-pay

## 2023-07-10 ENCOUNTER — Ambulatory Visit
Admission: RE | Admit: 2023-07-10 | Discharge: 2023-07-10 | Disposition: A | Discharge status: Home / Self Care | Payer: Managed Care, Other (non HMO) | Source: Ambulatory Visit | Attending: Family Medicine | Admitting: Family Medicine

## 2023-07-10 DIAGNOSIS — E119 Type 2 diabetes mellitus without complications: Secondary | ICD-10-CM

## 2023-07-10 DIAGNOSIS — Z1231 Encounter for screening mammogram for malignant neoplasm of breast: Secondary | ICD-10-CM | POA: Insufficient documentation

## 2023-07-10 DIAGNOSIS — E7801 Familial hypercholesterolemia: Secondary | ICD-10-CM

## 2023-07-10 DIAGNOSIS — I10 Essential (primary) hypertension: Secondary | ICD-10-CM

## 2023-07-10 NOTE — Progress Notes (Signed)
Printed labs

## 2023-07-11 LAB — COMPREHENSIVE METABOLIC PANEL
ALT: 40 IU/L — ABNORMAL HIGH (ref 0–32)
AST: 25 IU/L (ref 0–40)
Albumin: 4.5 g/dL (ref 3.8–4.9)
Alkaline Phosphatase: 87 IU/L (ref 44–121)
BUN/Creatinine Ratio: 16 (ref 9–23)
BUN: 17 mg/dL (ref 6–24)
Bilirubin Total: 0.4 mg/dL (ref 0.0–1.2)
CO2: 24 mmol/L (ref 20–29)
Calcium: 10 mg/dL (ref 8.7–10.2)
Chloride: 97 mmol/L (ref 96–106)
Creatinine, Ser: 1.06 mg/dL — ABNORMAL HIGH (ref 0.57–1.00)
Globulin, Total: 2.5 g/dL (ref 1.5–4.5)
Glucose: 169 mg/dL — ABNORMAL HIGH (ref 70–99)
Potassium: 3.8 mmol/L (ref 3.5–5.2)
Sodium: 138 mmol/L (ref 134–144)
Total Protein: 7 g/dL (ref 6.0–8.5)
eGFR: 61 mL/min/{1.73_m2} (ref >59)

## 2023-07-11 LAB — LIPID PANEL WITH LDL/HDL RATIO
Cholesterol, Total: 209 mg/dL — ABNORMAL HIGH (ref 100–199)
HDL: 58 mg/dL (ref >39)
LDL Chol Calc (NIH): 119 mg/dL — ABNORMAL HIGH (ref 0–99)
LDL/HDL Ratio: 2.1 ratio (ref 0.0–3.2)
Triglycerides: 185 mg/dL — ABNORMAL HIGH (ref 0–149)
VLDL Cholesterol Cal: 32 mg/dL (ref 5–40)

## 2023-07-11 LAB — HEMOGLOBIN A1C
Est. average glucose Bld gHb Est-mCnc: 263 mg/dL
Hgb A1c MFr Bld: 10.8 % — ABNORMAL HIGH (ref 4.8–5.6)

## 2023-07-16 ENCOUNTER — Telehealth: Payer: Self-pay | Admitting: Family Medicine

## 2023-07-16 NOTE — Telephone Encounter (Signed)
Copied from CRM [id number]. Topic: General - Inquiry >> Jul 16, 2023  9:28 AM Lennox Pippins wrote: Patient called, stated she had a missed call, she did not check her voicemail. Did not see where anyone called patient. Advised it could of been a reminder call for upcoming OV

## 2023-07-23 ENCOUNTER — Ambulatory Visit (INDEPENDENT_AMBULATORY_CARE_PROVIDER_SITE_OTHER): Payer: Managed Care, Other (non HMO) | Admitting: Family Medicine

## 2023-07-23 ENCOUNTER — Encounter: Payer: Self-pay | Admitting: Family Medicine

## 2023-07-23 VITALS — BP 126/78 | HR 86 | Ht 64.0 in | Wt 177.0 lb

## 2023-07-23 DIAGNOSIS — Z23 Encounter for immunization: Secondary | ICD-10-CM

## 2023-07-23 DIAGNOSIS — E119 Type 2 diabetes mellitus without complications: Secondary | ICD-10-CM | POA: Diagnosis not present

## 2023-07-23 DIAGNOSIS — Z7984 Long term (current) use of oral hypoglycemic drugs: Secondary | ICD-10-CM | POA: Diagnosis not present

## 2023-07-23 MED ORDER — METFORMIN HCL ER 750 MG PO TB24: 750.0000 mg | ORAL_TABLET | Freq: Every day | ORAL | 1 refills | Status: DC

## 2023-07-23 NOTE — Progress Notes (Signed)
Date:  07/23/2023   Name:  Pamela Solomon   DOB:  1965/12/17   MRN:  [id number]   Chief Complaint: Diabetes (Last A1C 10.8- has not missed any meds)  Diabetes She presents for her follow-up diabetic visit. She has type 2 diabetes mellitus. Her disease course has been stable. There are no hypoglycemic associated symptoms. There are no diabetic associated symptoms. Pertinent negatives for diabetes include no chest pain, no fatigue, no polydipsia, no polyphagia and no polyuria. There are no hypoglycemic complications. Symptoms are stable. There are no diabetic complications. Risk factors for coronary artery disease include diabetes mellitus. Her weight is stable. She is following a generally healthy diet. Meal planning includes avoidance of concentrated sweets (needs to count carbs). She participates in exercise intermittently.    Lab Results  Component Value Date   NA 138 07/10/2023   K 3.8 07/10/2023   CO2 24 07/10/2023   GLUCOSE 169 (H) 07/10/2023   BUN 17 07/10/2023   CREATININE 1.06 (H) 07/10/2023   CALCIUM 10.0 07/10/2023   EGFR 61 07/10/2023   GFRNONAA 62 08/28/2017   Lab Results  Component Value Date   CHOL 209 (H) 07/10/2023   HDL 58 07/10/2023   LDLCALC 119 (H) 07/10/2023   TRIG 185 (H) 07/10/2023   CHOLHDL 2.7 08/28/2017   Lab Results  Component Value Date   TSH 3.500 07/27/2017   Lab Results  Component Value Date   HGBA1C 10.8 (H) 07/10/2023   Lab Results  Component Value Date   WBC 9.1 07/27/2017   HGB 14.0 07/27/2017   HCT 42.4 07/27/2017   MCV 80 07/27/2017   PLT 268 07/27/2017   Lab Results  Component Value Date   ALT 40 (H) 07/10/2023   AST 25 07/10/2023   ALKPHOS 87 07/10/2023   BILITOT 0.4 07/10/2023   No results found for: "25OHVITD2", "25OHVITD3", "VD25OH"   Review of Systems  Constitutional:  Negative for fatigue, fever and unexpected weight change.  HENT:  Negative for congestion.   Eyes:  Negative for visual disturbance.   Respiratory:  Negative for cough, chest tightness, shortness of breath and wheezing.   Cardiovascular:  Negative for chest pain, palpitations and leg swelling.  Gastrointestinal:  Negative for abdominal distention, blood in stool, constipation, diarrhea and nausea.  Endocrine: Negative for polydipsia, polyphagia and polyuria.  Genitourinary:  Negative for difficulty urinating and vaginal bleeding.  Musculoskeletal:  Negative for arthralgias.    Patient Active Problem List   Diagnosis Date Noted   Encounter for screening colonoscopy 12/15/2022   Polyp of sigmoid colon 12/15/2022    No Known Allergies  Past Surgical History:  Procedure Laterality Date   ABDOMINAL HYSTERECTOMY     COLONOSCOPY WITH PROPOFOL N/A 12/15/2022   Procedure: COLONOSCOPY WITH PROPOFOL WITH POLYPECTOMY;  Surgeon: Midge Minium, MD;  Location: [facility];  Service: Endoscopy;  Laterality: N/A;    Social History   Tobacco Use   Smoking status: Never   Smokeless tobacco: Never  Substance Use Topics   Alcohol use: No   Drug use: No     Medication list has been reviewed and updated.  Current Meds  Medication Sig   carbamide peroxide (DEBROX) 6.5 % OTIC solution Place 5 drops into both ears 2 (two) times daily.   fluticasone (FLONASE) 50 MCG/ACT nasal spray Place 2 sprays into both nostrils daily.   losartan-hydrochlorothiazide (HYZAAR) 100-25 MG tablet Take 1 tablet by mouth daily.   metFORMIN (GLUCOPHAGE-XR) 500 MG 24 hr  tablet Take 1 tablet (500 mg total) by mouth daily.   TRADJENTA 5 MG TABS tablet Take 1 tablet (5 mg total) by mouth daily.       07/23/2023    1:37 PM 07/02/2023   10:23 AM 10/21/2022    2:16 PM 04/04/2022    2:14 PM  GAD 7 : Generalized Anxiety Score  Nervous, Anxious, on Edge 0 0 0 1  Control/stop worrying 0 0 0 1  Worry too much - different things 0 0 0 1  Trouble relaxing 0 0 0 0  Restless 0 0 0 0  Easily annoyed or irritable 0 0 0 0  Afraid - awful might happen 0 0  0 0  Total GAD 7 Score 0 0 0 3  Anxiety Difficulty Not difficult at all Not difficult at all Not difficult at all Not difficult at all       07/23/2023    1:37 PM 07/02/2023   10:23 AM 10/21/2022    2:16 PM  Depression screen PHQ 2/9  Decreased Interest 0 0 0  Down, Depressed, Hopeless 0 0 0  PHQ - 2 Score 0 0 0  Altered sleeping 0 0 0  Tired, decreased energy 0 0 0  Change in appetite 0 0 0  Feeling bad or failure about yourself  0 0 0  Trouble concentrating 0 0 0  Moving slowly or fidgety/restless 0 0 0  Suicidal thoughts 0 0 0  PHQ-9 Score 0 0 0  Difficult doing work/chores Not difficult at all Not difficult at all Not difficult at all    BP Readings from Last 3 Encounters:  07/23/23 126/78  07/02/23 118/82  12/15/22 112/71    Physical Exam Vitals and nursing note reviewed. Exam conducted with a chaperone present.  Constitutional:      General: She is not in acute distress.    Appearance: She is not diaphoretic.  HENT:     Head: Normocephalic and atraumatic.     Right Ear: Tympanic membrane and external ear normal.     Left Ear: Tympanic membrane and external ear normal.     Nose: Nose normal.     Mouth/Throat:     Mouth: Mucous membranes are moist.  Eyes:     General:        Right eye: No discharge.        Left eye: No discharge.     Conjunctiva/sclera: Conjunctivae normal.     Pupils: Pupils are equal, round, and reactive to light.  Neck:     Thyroid: No thyromegaly.     Vascular: No JVD.  Cardiovascular:     Rate and Rhythm: Normal rate and regular rhythm.     Heart sounds: Normal heart sounds. No murmur heard.    No friction rub. No gallop.  Pulmonary:     Effort: Pulmonary effort is normal.     Breath sounds: Normal breath sounds. No wheezing, rhonchi or rales.  Abdominal:     General: Bowel sounds are normal.     Palpations: Abdomen is soft. There is no mass.     Tenderness: There is no abdominal tenderness. There is no guarding.  Musculoskeletal:         General: Normal range of motion.     Cervical back: Normal range of motion and neck supple.  Lymphadenopathy:     Cervical: No cervical adenopathy.  Skin:    General: Skin is warm and dry.  Neurological:     Mental  Status: She is alert.     Wt Readings from Last 3 Encounters:  07/23/23 177 lb (80.3 kg)  07/02/23 178 lb (80.7 kg)  12/15/22 176 lb (79.8 kg)    BP 126/78   Pulse 86   Ht 5\' 4"  (1.626 m)   Wt 177 lb (80.3 kg)   SpO2 98%   BMI 30.38 kg/m   Assessment and Plan:  1. Diabetes mellitus treated with oral medication (HCC) Chronic.  Uncontrolled.  Stable.  Previous A1c's have been near 7 but most recent was over 10.  Patient admits to indiscretion with her diet and has not been eating healthy.  We reemphasized low glycemic choices and these were provided for the patient.  In the meantime we will increase her metformin XR to 750 and maintain her on Tradjenta 5 mg current dosing.  Will recheck patient in 6 weeks with an A1c and pending results determine how to get back with her endocrinologist. - metFORMIN (GLUCOPHAGE-XR) 750 MG 24 hr tablet; Take 1 tablet (750 mg total) by mouth daily with breakfast.  Dispense: 60 tablet; Refill: 1  2. Flu vaccine need Discussed and administered - Flu vaccine trivalent PF, 6mos and older(Flulaval,Afluria,Fluarix,Fluzone)    Elizabeth Sauer, MD

## 2023-07-23 NOTE — Patient Instructions (Signed)

## 2023-09-04 ENCOUNTER — Ambulatory Visit: Payer: Managed Care, Other (non HMO) | Admitting: Family Medicine

## 2023-09-08 ENCOUNTER — Ambulatory Visit: Payer: Managed Care, Other (non HMO) | Admitting: Family Medicine

## 2023-09-08 ENCOUNTER — Encounter: Payer: Self-pay | Admitting: Family Medicine

## 2023-09-08 VITALS — BP 98/68 | HR 96 | Ht 64.0 in | Wt 174.0 lb

## 2023-09-08 DIAGNOSIS — E119 Type 2 diabetes mellitus without complications: Secondary | ICD-10-CM | POA: Diagnosis not present

## 2023-09-08 DIAGNOSIS — I10 Essential (primary) hypertension: Secondary | ICD-10-CM | POA: Diagnosis not present

## 2023-09-08 DIAGNOSIS — Z7984 Long term (current) use of oral hypoglycemic drugs: Secondary | ICD-10-CM | POA: Diagnosis not present

## 2023-09-08 MED ORDER — METFORMIN HCL ER 750 MG PO TB24: 750.0000 mg | ORAL_TABLET | Freq: Every day | ORAL | 1 refills | Status: DC

## 2023-09-08 MED ORDER — LOSARTAN POTASSIUM-HCTZ 100-25 MG PO TABS: 1.0000 | ORAL_TABLET | Freq: Every day | ORAL | 1 refills | Status: DC

## 2023-09-08 MED ORDER — TRADJENTA 5 MG PO TABS: 5.0000 mg | ORAL_TABLET | Freq: Every day | ORAL | 1 refills | Status: DC

## 2023-09-08 NOTE — Progress Notes (Signed)
Date:  09/08/2023   Name:  Pamela Solomon   DOB:  February 19, 1966   MRN:  [id number]   Chief Complaint: Diabetes  Diabetes She presents for her follow-up diabetic visit. She has type 2 diabetes mellitus. Her disease course has been stable. There are no hypoglycemic associated symptoms. Pertinent negatives for hypoglycemia include no headaches or sweats. There are no diabetic associated symptoms. Pertinent negatives for diabetes include no blurred vision, no chest pain, no polydipsia and no polyuria. There are no hypoglycemic complications. Symptoms are stable. There are no diabetic complications. Pertinent negatives for diabetic complications include no CVA. There are no known risk factors for coronary artery disease. Current diabetic treatment includes oral agent (dual therapy). She is compliant with treatment all of the time. She is following a generally healthy diet. Meal planning includes avoidance of concentrated sweets and carbohydrate counting. She participates in exercise daily. Her breakfast blood glucose is taken between 8-9 am. Her breakfast blood glucose range is generally 110-130 mg/dl. An ACE inhibitor/angiotensin II receptor blocker is being taken.  Hypertension This is a chronic problem. The current episode started more than 1 year ago. The problem has been gradually improving since onset. The problem is controlled. Pertinent negatives include no anxiety, blurred vision, chest pain, headaches, malaise/fatigue, neck pain, orthopnea, palpitations, peripheral edema, PND, shortness of breath or sweats. There are no associated agents to hypertension. There are no known risk factors for coronary artery disease. Past treatments include angiotensin blockers. The current treatment provides moderate improvement. There is no history of CAD/MI, CVA or left ventricular hypertrophy. There is no history of chronic renal disease, a hypertension causing med or renovascular disease.    Lab Results   Component Value Date   NA 138 07/10/2023   K 3.8 07/10/2023   CO2 24 07/10/2023   GLUCOSE 169 (H) 07/10/2023   BUN 17 07/10/2023   CREATININE 1.06 (H) 07/10/2023   CALCIUM 10.0 07/10/2023   EGFR 61 07/10/2023   GFRNONAA 62 08/28/2017   Lab Results  Component Value Date   CHOL 209 (H) 07/10/2023   HDL 58 07/10/2023   LDLCALC 119 (H) 07/10/2023   TRIG 185 (H) 07/10/2023   CHOLHDL 2.7 08/28/2017   Lab Results  Component Value Date   TSH 3.500 07/27/2017   Lab Results  Component Value Date   HGBA1C 10.8 (H) 07/10/2023   Lab Results  Component Value Date   WBC 9.1 07/27/2017   HGB 14.0 07/27/2017   HCT 42.4 07/27/2017   MCV 80 07/27/2017   PLT 268 07/27/2017   Lab Results  Component Value Date   ALT 40 (H) 07/10/2023   AST 25 07/10/2023   ALKPHOS 87 07/10/2023   BILITOT 0.4 07/10/2023   No results found for: "25OHVITD2", "25OHVITD3", "VD25OH"   Review of Systems  Constitutional:  Negative for malaise/fatigue and unexpected weight change.  HENT:  Negative for congestion.   Eyes:  Negative for blurred vision and visual disturbance.  Respiratory:  Negative for apnea, cough, choking, chest tightness, shortness of breath, wheezing and stridor.   Cardiovascular:  Negative for chest pain, palpitations, orthopnea, leg swelling and PND.  Gastrointestinal:  Negative for abdominal pain and blood in stool.  Endocrine: Negative for polydipsia and polyuria.  Genitourinary:  Negative for difficulty urinating and vaginal bleeding.  Musculoskeletal:  Negative for neck pain.  Neurological:  Negative for headaches.    Patient Active Problem List   Diagnosis Date Noted   Encounter for screening colonoscopy 12/15/2022  Polyp of sigmoid colon 12/15/2022    No Known Allergies  Past Surgical History:  Procedure Laterality Date   ABDOMINAL HYSTERECTOMY     COLONOSCOPY WITH PROPOFOL N/A 12/15/2022   Procedure: COLONOSCOPY WITH PROPOFOL WITH POLYPECTOMY;  Surgeon: Midge Minium,  MD;  Location: [facility];  Service: Endoscopy;  Laterality: N/A;    Social History   Tobacco Use   Smoking status: Never   Smokeless tobacco: Never  Substance Use Topics   Alcohol use: No   Drug use: No     Medication list has been reviewed and updated.  Current Meds  Medication Sig   fluticasone (FLONASE) 50 MCG/ACT nasal spray Place 2 sprays into both nostrils daily.   glucose blood (PRECISION QID TEST) test strip 1 each (1 strip total) 3 (three) times daily Product selection permitted according to insurance preference. E11.9 Type 2 diabetes mellitus   losartan-hydrochlorothiazide (HYZAAR) 100-25 MG tablet Take 1 tablet by mouth daily.   metFORMIN (GLUCOPHAGE-XR) 750 MG 24 hr tablet Take 1 tablet (750 mg total) by mouth daily with breakfast.   TRADJENTA 5 MG TABS tablet Take 1 tablet (5 mg total) by mouth daily.   [DISCONTINUED] Cysteamine Bitartrate (PROCYSBI) 300 MG PACK Use 1 each 3 (three) times daily Product selection permitted according to insurance preference. E11.9 Type 2 diabetes mellitus       09/08/2023    1:43 PM 07/23/2023    1:37 PM 07/02/2023   10:23 AM 10/21/2022    2:16 PM  GAD 7 : Generalized Anxiety Score  Nervous, Anxious, on Edge 0 0 0 0  Control/stop worrying 1 0 0 0  Worry too much - different things 1 0 0 0  Trouble relaxing 0 0 0 0  Restless 0 0 0 0  Easily annoyed or irritable 1 0 0 0  Afraid - awful might happen 0 0 0 0  Total GAD 7 Score 3 0 0 0  Anxiety Difficulty Not difficult at all Not difficult at all Not difficult at all Not difficult at all       09/08/2023    1:42 PM 07/23/2023    1:37 PM 07/02/2023   10:23 AM  Depression screen PHQ 2/9  Decreased Interest 1 0 0  Down, Depressed, Hopeless 0 0 0  PHQ - 2 Score 1 0 0  Altered sleeping 1 0 0  Tired, decreased energy 2 0 0  Change in appetite 1 0 0  Feeling bad or failure about yourself  0 0 0  Trouble concentrating 1 0 0  Moving slowly or fidgety/restless 0 0 0  Suicidal  thoughts 0 0 0  PHQ-9 Score 6 0 0  Difficult doing work/chores Not difficult at all Not difficult at all Not difficult at all    BP Readings from Last 3 Encounters:  09/08/23 98/68  07/23/23 126/78  07/02/23 118/82    Physical Exam Vitals and nursing note reviewed. Exam conducted with a chaperone present.  Constitutional:      General: She is not in acute distress.    Appearance: She is not diaphoretic.  HENT:     Head: Normocephalic and atraumatic.     Right Ear: Tympanic membrane and external ear normal.     Left Ear: Tympanic membrane and external ear normal.     Nose: Nose normal. No congestion.     Mouth/Throat:     Mouth: Mucous membranes are moist.  Eyes:     General:  Right eye: No discharge.        Left eye: No discharge.     Conjunctiva/sclera: Conjunctivae normal.     Pupils: Pupils are equal, round, and reactive to light.  Neck:     Thyroid: No thyromegaly.     Vascular: No JVD.  Cardiovascular:     Rate and Rhythm: Normal rate and regular rhythm.     Heart sounds: Normal heart sounds. No murmur heard.    No friction rub. No gallop.  Pulmonary:     Effort: Pulmonary effort is normal.     Breath sounds: Normal breath sounds. No wheezing, rhonchi or rales.  Abdominal:     General: Bowel sounds are normal.     Palpations: Abdomen is soft. There is no mass.     Tenderness: There is no abdominal tenderness. There is no guarding.  Musculoskeletal:        General: Normal range of motion.     Cervical back: Normal range of motion and neck supple.  Lymphadenopathy:     Cervical: No cervical adenopathy.  Skin:    General: Skin is warm and dry.  Neurological:     Mental Status: She is alert.     Wt Readings from Last 3 Encounters:  09/08/23 174 lb (78.9 kg)  07/23/23 177 lb (80.3 kg)  07/02/23 178 lb (80.7 kg)    BP 98/68   Pulse 96   Ht 5\' 4"  (1.626 m)   Wt 174 lb (78.9 kg)   SpO2 96%   BMI 29.87 kg/m   Assessment and Plan:  1. Diabetes  mellitus treated with oral medication (HCC) Chronic.  Controlled.  Stable.  Asymptomatic.  Tolerating medications well.  Including metformin XR 750 mg once a day and Tradjenta 5 mg once a day.  Will recheck in 4 months.  In the meantime we will check A1c to see what current level of control is. - Hemoglobin A1c - metFORMIN (GLUCOPHAGE-XR) 750 MG 24 hr tablet; Take 1 tablet (750 mg total) by mouth daily with breakfast.  Dispense: 60 tablet; Refill: 1 - TRADJENTA 5 MG TABS tablet; Take 1 tablet (5 mg total) by mouth daily.  Dispense: 90 tablet; Refill: 1  2. Primary hypertension Chronic.  Controlled.  Stable.  Blood pressure 98/68.  Continue losartan hydrochlorothiazide 100-25 mg once a day.  Will recheck blood pressure in 4 to 6 months upon return.  We will recheck renal function panel.  At next visit. - losartan-hydrochlorothiazide (HYZAAR) 100-25 MG tablet; Take 1 tablet by mouth daily.  Dispense: 90 tablet; Refill: 1    Elizabeth Sauer, MD

## 2023-09-09 ENCOUNTER — Other Ambulatory Visit: Payer: Self-pay

## 2023-09-09 DIAGNOSIS — E1169 Type 2 diabetes mellitus with other specified complication: Secondary | ICD-10-CM

## 2023-09-09 LAB — HEMOGLOBIN A1C
Est. average glucose Bld gHb Est-mCnc: 209 mg/dL
Hgb A1c MFr Bld: 8.9 % — ABNORMAL HIGH (ref 4.8–5.6)

## 2023-10-02 ENCOUNTER — Ambulatory Visit: Payer: Managed Care, Other (non HMO)

## 2024-01-04 ENCOUNTER — Ambulatory Visit: Payer: Managed Care, Other (non HMO) | Admitting: Family Medicine

## 2024-01-04 ENCOUNTER — Encounter: Payer: Self-pay | Admitting: Family Medicine

## 2024-01-04 VITALS — BP 112/78 | HR 68 | Resp 16 | Ht 64.0 in | Wt 176.0 lb

## 2024-01-04 DIAGNOSIS — E119 Type 2 diabetes mellitus without complications: Secondary | ICD-10-CM | POA: Diagnosis not present

## 2024-01-04 DIAGNOSIS — E7801 Familial hypercholesterolemia: Secondary | ICD-10-CM | POA: Diagnosis not present

## 2024-01-04 DIAGNOSIS — I10 Essential (primary) hypertension: Secondary | ICD-10-CM | POA: Diagnosis not present

## 2024-01-04 DIAGNOSIS — Z7984 Long term (current) use of oral hypoglycemic drugs: Secondary | ICD-10-CM | POA: Diagnosis not present

## 2024-01-04 MED ORDER — LOSARTAN POTASSIUM-HCTZ 100-25 MG PO TABS: 1.0000 | ORAL_TABLET | Freq: Every day | ORAL | 1 refills | Status: DC

## 2024-01-04 MED ORDER — METFORMIN HCL ER 750 MG PO TB24: 750.0000 mg | ORAL_TABLET | Freq: Every day | ORAL | 1 refills | Status: DC

## 2024-01-04 MED ORDER — TRADJENTA 5 MG PO TABS: 5.0000 mg | ORAL_TABLET | Freq: Every day | ORAL | 1 refills | Status: DC

## 2024-01-04 NOTE — Progress Notes (Signed)
 Date:  01/04/2024   Name:  Pamela Solomon   DOB:  Oct 15, 1966   MRN:  [id number]   Chief Complaint: Diabetes and Hypertension  Diabetes She presents for her follow-up diabetic visit. She has type 2 diabetes mellitus. Her disease course has been worsening. There are no hypoglycemic associated symptoms. Pertinent negatives for hypoglycemia include no headaches. Pertinent negatives for diabetes include no blurred vision, no chest pain, no fatigue, no foot paresthesias, no foot ulcerations, no polydipsia, no polyuria, no visual change, no weakness and no weight loss. There are no hypoglycemic complications. Symptoms are stable. There are no diabetic complications. She is compliant with treatment none of the time. She is following a generally unhealthy diet. Meal planning includes avoidance of concentrated sweets and carbohydrate counting. She participates in exercise intermittently. An ACE inhibitor/angiotensin II receptor blocker is being taken.  Hypertension This is a chronic problem. The current episode started more than 1 year ago. The problem is uncontrolled. Pertinent negatives include no blurred vision, chest pain, headaches, orthopnea, palpitations, peripheral edema, PND or shortness of breath. There are no associated agents to hypertension.    Lab Results  Component Value Date   NA 138 07/10/2023   K 3.8 07/10/2023   CO2 24 07/10/2023   GLUCOSE 169 (H) 07/10/2023   BUN 17 07/10/2023   CREATININE 1.06 (H) 07/10/2023   CALCIUM 10.0 07/10/2023   EGFR 61 07/10/2023   GFRNONAA 62 08/28/2017   Lab Results  Component Value Date   CHOL 209 (H) 07/10/2023   HDL 58 07/10/2023   LDLCALC 119 (H) 07/10/2023   TRIG 185 (H) 07/10/2023   CHOLHDL 2.7 08/28/2017   Lab Results  Component Value Date   TSH 3.500 07/27/2017   Lab Results  Component Value Date   HGBA1C 8.9 (H) 09/08/2023   Lab Results  Component Value Date   WBC 9.1 07/27/2017   HGB 14.0 07/27/2017   HCT 42.4 07/27/2017    MCV 80 07/27/2017   PLT 268 07/27/2017   Lab Results  Component Value Date   ALT 40 (H) 07/10/2023   AST 25 07/10/2023   ALKPHOS 87 07/10/2023   BILITOT 0.4 07/10/2023   No results found for: "25OHVITD2", "25OHVITD3", "VD25OH"   Review of Systems  Constitutional:  Negative for fatigue, unexpected weight change and weight loss.  Eyes:  Negative for blurred vision and visual disturbance.  Respiratory:  Negative for cough, chest tightness, shortness of breath, wheezing and stridor.   Cardiovascular:  Negative for chest pain, palpitations, orthopnea, leg swelling and PND.  Gastrointestinal:  Negative for abdominal pain.  Endocrine: Negative for polydipsia and polyuria.  Genitourinary:  Negative for difficulty urinating.  Neurological:  Negative for weakness and headaches.  Hematological:  Negative for adenopathy.    Patient Active Problem List   Diagnosis Date Noted   Encounter for screening colonoscopy 12/15/2022   Polyp of sigmoid colon 12/15/2022    No Known Allergies  Past Surgical History:  Procedure Laterality Date   ABDOMINAL HYSTERECTOMY     COLONOSCOPY WITH PROPOFOL N/A 12/15/2022   Procedure: COLONOSCOPY WITH PROPOFOL WITH POLYPECTOMY;  Surgeon: Midge Minium, MD;  Location: [facility];  Service: Endoscopy;  Laterality: N/A;    Social History   Tobacco Use   Smoking status: Never   Smokeless tobacco: Never  Substance Use Topics   Alcohol use: No   Drug use: No     Medication list has been reviewed and updated.  Current Meds  Medication  Sig   fluticasone (FLONASE) 50 MCG/ACT nasal spray Place 2 sprays into both nostrils daily.   glucose blood (PRECISION QID TEST) test strip 1 each (1 strip total) 3 (three) times daily Product selection permitted according to insurance preference. E11.9 Type 2 diabetes mellitus   losartan-hydrochlorothiazide (HYZAAR) 100-25 MG tablet Take 1 tablet by mouth daily.   metFORMIN (GLUCOPHAGE-XR) 750 MG 24 hr tablet  Take 1 tablet (750 mg total) by mouth daily with breakfast.   TRADJENTA 5 MG TABS tablet Take 1 tablet (5 mg total) by mouth daily.       09/08/2023    1:43 PM 07/23/2023    1:37 PM 07/02/2023   10:23 AM 10/21/2022    2:16 PM  GAD 7 : Generalized Anxiety Score  Nervous, Anxious, on Edge 0 0 0 0  Control/stop worrying 1 0 0 0  Worry too much - different things 1 0 0 0  Trouble relaxing 0 0 0 0  Restless 0 0 0 0  Easily annoyed or irritable 1 0 0 0  Afraid - awful might happen 0 0 0 0  Total GAD 7 Score 3 0 0 0  Anxiety Difficulty Not difficult at all Not difficult at all Not difficult at all Not difficult at all       01/04/2024    9:54 AM 09/08/2023    1:42 PM 07/23/2023    1:37 PM  Depression screen PHQ 2/9  Decreased Interest 1 1 0  Down, Depressed, Hopeless 0 0 0  PHQ - 2 Score 1 1 0  Altered sleeping  1 0  Tired, decreased energy  2 0  Change in appetite  1 0  Feeling bad or failure about yourself   0 0  Trouble concentrating  1 0  Moving slowly or fidgety/restless  0 0  Suicidal thoughts  0 0  PHQ-9 Score  6 0  Difficult doing work/chores  Not difficult at all Not difficult at all    BP Readings from Last 3 Encounters:  01/04/24 112/78  09/08/23 98/68  07/23/23 126/78    Physical Exam Vitals and nursing note reviewed.  Constitutional:      General: She is not in acute distress.    Appearance: She is not diaphoretic.  HENT:     Head: Normocephalic and atraumatic.     Right Ear: External ear normal.     Left Ear: External ear normal.     Nose: Nose normal.     Mouth/Throat:     Mouth: Mucous membranes are moist.  Eyes:     General:        Right eye: No discharge.        Left eye: No discharge.     Conjunctiva/sclera: Conjunctivae normal.     Pupils: Pupils are equal, round, and reactive to light.  Neck:     Thyroid: No thyromegaly.     Vascular: No JVD.  Cardiovascular:     Rate and Rhythm: Normal rate and regular rhythm.     Heart sounds: Normal  heart sounds. No murmur heard.    No friction rub. No gallop.  Pulmonary:     Effort: Pulmonary effort is normal.     Breath sounds: Normal breath sounds.  Abdominal:     General: Bowel sounds are normal.     Palpations: Abdomen is soft. There is no mass.     Tenderness: There is no abdominal tenderness. There is no guarding.  Musculoskeletal:  General: Normal range of motion.     Cervical back: Normal range of motion and neck supple.  Lymphadenopathy:     Cervical: No cervical adenopathy.  Skin:    General: Skin is warm and dry.  Neurological:     Mental Status: She is alert.     Deep Tendon Reflexes: Reflexes are normal and symmetric.     Wt Readings from Last 3 Encounters:  01/04/24 176 lb (79.8 kg)  09/08/23 174 lb (78.9 kg)  07/23/23 177 lb (80.3 kg)    BP 112/78   Pulse 68   Resp 16   Ht 5\' 4"  (1.626 m)   Wt 176 lb (79.8 kg)   LMP  (LMP Unknown)   SpO2 100%   BMI 30.21 kg/m   Assessment and Plan: 1. Primary hypertension Chronic.  Controlled.  Stable.  Blood pressure 112/78.  Asymptomatic.  Tolerating medication well.  Continue losartan-hydrochlorothiazide 100-25 mg once a day.  Will recheck in 6 months.  Patient will take medication and return in 6 weeks at which time we will do labs fasting. - losartan-hydrochlorothiazide (HYZAAR) 100-25 MG tablet; Take 1 tablet by mouth daily.  Dispense: 90 tablet; Refill: 1  2. Diabetes mellitus treated with oral medication (HCC) (Primary) Chronic.  Likely uncontrolled.  Perhaps stable.  Patient is supposed to be followed by [facility] endocrinology [facility] however she has not returned since January 2023.  I am rather concerned that patient has been taking her medication as well so we will resume metformin XR 750 mg and Tradjenta 5 mg daily.  I know that this is probably elevated so we will recheck A1c upon return and in the meantime referral to endocrinology has been placed with [facility] and that it will be  performed on July 3 315 at [facility] location. - metFORMIN (GLUCOPHAGE-XR) 750 MG 24 hr tablet; Take 1 tablet (750 mg total) by mouth daily with breakfast.  Dispense: 60 tablet; Refill: 1 - TRADJENTA 5 MG TABS tablet; Take 1 tablet (5 mg total) by mouth daily.  Dispense: 90 tablet; Refill: 1 - Ambulatory referral to Endocrinology  3. Familial hypercholesterolemia Chronic.  Controlled.  Stable.  Patient is doing well with dietary control and I will include dietary guidelines for patient to follow and will likely recheck this when patient returns in 6 weeks.    Elizabeth Sauer, MD

## 2024-01-04 NOTE — Patient Instructions (Signed)

## 2024-02-15 ENCOUNTER — Ambulatory Visit: Payer: Managed Care, Other (non HMO) | Admitting: Family Medicine

## 2024-02-15 ENCOUNTER — Encounter: Payer: Self-pay | Admitting: Family Medicine

## 2024-02-15 VITALS — BP 118/78 | HR 80 | Ht 64.0 in | Wt 175.0 lb

## 2024-02-15 DIAGNOSIS — J301 Allergic rhinitis due to pollen: Secondary | ICD-10-CM

## 2024-02-15 DIAGNOSIS — I10 Essential (primary) hypertension: Secondary | ICD-10-CM

## 2024-02-15 DIAGNOSIS — Z7984 Long term (current) use of oral hypoglycemic drugs: Secondary | ICD-10-CM

## 2024-02-15 DIAGNOSIS — E7801 Familial hypercholesterolemia: Secondary | ICD-10-CM | POA: Diagnosis not present

## 2024-02-15 DIAGNOSIS — E119 Type 2 diabetes mellitus without complications: Secondary | ICD-10-CM

## 2024-02-15 MED ORDER — TRADJENTA 5 MG PO TABS: 5.0000 mg | ORAL_TABLET | Freq: Every day | ORAL | 1 refills | Status: AC

## 2024-02-15 MED ORDER — METFORMIN HCL ER 750 MG PO TB24: 750.0000 mg | ORAL_TABLET | Freq: Every day | ORAL | 1 refills | Status: DC

## 2024-02-15 MED ORDER — FLUTICASONE PROPIONATE 50 MCG/ACT NA SUSP: 2.0000 | Freq: Every day | NASAL | 6 refills | Status: AC

## 2024-02-15 MED ORDER — LOSARTAN POTASSIUM-HCTZ 100-25 MG PO TABS: 1.0000 | ORAL_TABLET | Freq: Every day | ORAL | 1 refills | Status: AC

## 2024-02-15 NOTE — Patient Instructions (Signed)

## 2024-02-15 NOTE — Progress Notes (Signed)
 Date:  02/15/2024   Name:  Pamela Solomon   DOB:  [birth year]   MRN:  [id number]   Chief Complaint: Medical Management of Chronic Issues  Hypertension This is a chronic problem. The current episode started more than 1 year ago. The problem has been gradually improving since onset. The problem is controlled. Pertinent negatives include no anxiety, blurred vision, chest pain, headaches, orthopnea, palpitations, peripheral edema, PND, shortness of breath or sweats. There are no associated agents to hypertension. Risk factors for coronary artery disease include dyslipidemia. Past treatments include angiotensin blockers and diuretics. There are no compliance problems.  There is no history of CAD/MI or CVA. There is no history of chronic renal disease, a hypertension causing med or renovascular disease.  Diabetes She presents for her follow-up diabetic visit. She has type 2 diabetes mellitus. Her disease course has been stable. There are no hypoglycemic associated symptoms. Pertinent negatives for hypoglycemia include no dizziness, headaches, nervousness/anxiousness or sweats. Pertinent negatives for diabetes include no blurred vision, no chest pain, no fatigue, no foot paresthesias, no foot ulcerations, no polydipsia, no polyuria, no visual change, no weakness and no weight loss. There are no hypoglycemic complications. Symptoms are worsening. There are no diabetic complications. Pertinent negatives for diabetic complications include no CVA. Current diabetic treatment includes oral agent (dual therapy). Her weight is stable. She is following a generally healthy diet. Meal planning includes avoidance of concentrated sweets. Her breakfast blood glucose range is generally 110-130 mg/dl. An ACE inhibitor/angiotensin II receptor blocker is being taken.  Hyperlipidemia This is a chronic problem. The current episode started more than 1 month ago. The problem is controlled. She has no history of chronic renal  disease, diabetes, hypothyroidism, liver disease, obesity or nephrotic syndrome. Pertinent negatives include no chest pain, focal sensory loss, focal weakness, leg pain, myalgias or shortness of breath. Current antihyperlipidemic treatment includes diet change. The current treatment provides moderate improvement of lipids. There are no compliance problems.  Risk factors for coronary artery disease include hypertension, dyslipidemia and diabetes mellitus.    Lab Results  Component Value Date   NA 138 07/10/2023   K 3.8 07/10/2023   CO2 24 07/10/2023   GLUCOSE 169 (H) 07/10/2023   BUN 17 07/10/2023   CREATININE 1.06 (H) 07/10/2023   CALCIUM 10.0 07/10/2023   EGFR 61 07/10/2023   GFRNONAA 62 08/28/2017   Lab Results  Component Value Date   CHOL 209 (H) 07/10/2023   HDL 58 07/10/2023   LDLCALC 119 (H) 07/10/2023   TRIG 185 (H) 07/10/2023   CHOLHDL 2.7 08/28/2017   Lab Results  Component Value Date   TSH 3.500 07/27/2017   Lab Results  Component Value Date   HGBA1C 8.9 (H) 09/08/2023   Lab Results  Component Value Date   WBC 9.1 07/27/2017   HGB 14.0 07/27/2017   HCT 42.4 07/27/2017   MCV 80 07/27/2017   PLT 268 07/27/2017   Lab Results  Component Value Date   ALT 40 (H) 07/10/2023   AST 25 07/10/2023   ALKPHOS 87 07/10/2023   BILITOT 0.4 07/10/2023   No results found for: "25OHVITD2", "25OHVITD3", "VD25OH"   Review of Systems  Constitutional: Negative.  Negative for chills, fatigue, fever, unexpected weight change and weight loss.  HENT:  Negative for congestion, ear discharge, ear pain, rhinorrhea, sinus pressure, sneezing and sore throat.   Eyes:  Negative for blurred vision.  Respiratory:  Negative for cough, shortness of breath, wheezing and stridor.  Cardiovascular:  Negative for chest pain, palpitations, orthopnea and PND.  Gastrointestinal:  Negative for abdominal pain, blood in stool, constipation, diarrhea and nausea.  Endocrine: Negative for polydipsia  and polyuria.  Genitourinary:  Negative for dysuria, flank pain, frequency, hematuria, urgency and vaginal discharge.  Musculoskeletal:  Negative for arthralgias, back pain and myalgias.  Skin:  Negative for rash.  Neurological:  Negative for dizziness, focal weakness, weakness and headaches.  Hematological:  Negative for adenopathy. Does not bruise/bleed easily.  Psychiatric/Behavioral:  Negative for dysphoric mood. The patient is not nervous/anxious.     Patient Active Problem List   Diagnosis Date Noted   Encounter for screening colonoscopy 12/15/2022   Polyp of sigmoid colon 12/15/2022    No Known Allergies  Past Surgical History:  Procedure Laterality Date   ABDOMINAL HYSTERECTOMY     COLONOSCOPY WITH PROPOFOL N/A 12/15/2022   Procedure: COLONOSCOPY WITH PROPOFOL WITH POLYPECTOMY;  Surgeon: Midge Minium, MD;  Location: [facility];  Service: Endoscopy;  Laterality: N/A;    Social History   Tobacco Use   Smoking status: Never   Smokeless tobacco: Never  Substance Use Topics   Alcohol use: No   Drug use: No     Medication list has been reviewed and updated.  Current Meds  Medication Sig   fluticasone (FLONASE) 50 MCG/ACT nasal spray Place 2 sprays into both nostrils daily.   glucose blood (PRECISION QID TEST) test strip 1 each (1 strip total) 3 (three) times daily Product selection permitted according to insurance preference. E11.9 Type 2 diabetes mellitus   losartan-hydrochlorothiazide (HYZAAR) 100-25 MG tablet Take 1 tablet by mouth daily.   metFORMIN (GLUCOPHAGE-XR) 750 MG 24 hr tablet Take 1 tablet (750 mg total) by mouth daily with breakfast.   TRADJENTA 5 MG TABS tablet Take 1 tablet (5 mg total) by mouth daily.       09/08/2023    1:43 PM 07/23/2023    1:37 PM 07/02/2023   10:23 AM 10/21/2022    2:16 PM  GAD 7 : Generalized Anxiety Score  Nervous, Anxious, on Edge 0 0 0 0  Control/stop worrying 1 0 0 0  Worry too much - different things 1 0 0 0   Trouble relaxing 0 0 0 0  Restless 0 0 0 0  Easily annoyed or irritable 1 0 0 0  Afraid - awful might happen 0 0 0 0  Total GAD 7 Score 3 0 0 0  Anxiety Difficulty Not difficult at all Not difficult at all Not difficult at all Not difficult at all       01/04/2024    9:54 AM 09/08/2023    1:42 PM 07/23/2023    1:37 PM  Depression screen PHQ 2/9  Decreased Interest 1 1 0  Down, Depressed, Hopeless 0 0 0  PHQ - 2 Score 1 1 0  Altered sleeping  1 0  Tired, decreased energy  2 0  Change in appetite  1 0  Feeling bad or failure about yourself   0 0  Trouble concentrating  1 0  Moving slowly or fidgety/restless  0 0  Suicidal thoughts  0 0  PHQ-9 Score  6 0  Difficult doing work/chores  Not difficult at all Not difficult at all    BP Readings from Last 3 Encounters:  02/15/24 118/78  01/04/24 112/78  09/08/23 98/68    Physical Exam Vitals and nursing note reviewed.  Constitutional:      General: She is not  in acute distress.    Appearance: She is not diaphoretic.  HENT:     Head: Normocephalic and atraumatic.     Right Ear: Tympanic membrane, ear canal and external ear normal.     Left Ear: Tympanic membrane, ear canal and external ear normal.     Nose: Nose normal.  Eyes:     General:        Right eye: No discharge.        Left eye: No discharge.     Conjunctiva/sclera: Conjunctivae normal.     Pupils: Pupils are equal, round, and reactive to light.  Neck:     Thyroid: No thyromegaly.     Vascular: No JVD.  Cardiovascular:     Rate and Rhythm: Normal rate and regular rhythm.     Heart sounds: Normal heart sounds, S1 normal and S2 normal. No murmur heard.    No systolic murmur is present.     No diastolic murmur is present.     No friction rub. No gallop. No S3 or S4 sounds.  Pulmonary:     Effort: Pulmonary effort is normal.     Breath sounds: Normal breath sounds. No wheezing, rhonchi or rales.  Abdominal:     General: Bowel sounds are normal.      Palpations: Abdomen is soft. There is no mass.     Tenderness: There is no abdominal tenderness. There is no guarding.  Musculoskeletal:        General: Normal range of motion.     Cervical back: Normal range of motion and neck supple.     Right lower leg: No edema.     Left lower leg: No edema.  Lymphadenopathy:     Cervical: No cervical adenopathy.  Skin:    General: Skin is warm and dry.  Neurological:     Mental Status: She is alert.     Deep Tendon Reflexes: Reflexes are normal and symmetric.     Wt Readings from Last 3 Encounters:  02/15/24 175 lb (79.4 kg)  01/04/24 176 lb (79.8 kg)  09/08/23 174 lb (78.9 kg)    BP 118/78   Pulse 80   Ht 5\' 4"  (1.626 m)   Wt 175 lb (79.4 kg)   LMP  (LMP Unknown)   SpO2 99%   BMI 30.04 kg/m   Assessment and Plan: 1. Diabetes mellitus treated with oral medication (HCC) (Primary) Chronic.  Pending controlled.  Asymptomatic.  Patient is pending controlled due to not being on her medications when evaluated 6 weeks ago.  Patient was restarted on her metformin XR 750 once a day and Tradjenta 5 mg once a day.  We will check fasting glucose with renal function panel and A1c.  Will recheck patient in 4 months. - metFORMIN (GLUCOPHAGE-XR) 750 MG 24 hr tablet; Take 1 tablet (750 mg total) by mouth daily with breakfast.  Dispense: 60 tablet; Refill: 1 - TRADJENTA 5 MG TABS tablet; Take 1 tablet (5 mg total) by mouth daily.  Dispense: 90 tablet; Refill: 1 - Microalbumin / creatinine urine ratio - Renal Function Panel - Hemoglobin A1c - Lipid Panel With LDL/HDL Ratio  2. Primary hypertension Chronic.  Controlled.  Stable.  Asymptomatic.  Tolerating medications well.  Blood pressure 118/78.  Continue losartan hydrochlorothiazide 100-25 mg once a day.  Will check renal function panel for electrolytes and GFR.  We are also checking glucose for fasting state since she is back on her medications. - losartan-hydrochlorothiazide (HYZAAR) 100-25 MG  tablet; Take 1 tablet by mouth daily.  Dispense: 90 tablet; Refill: 1 - Renal Function Panel  3. Familial hypercholesterolemia Chronic.  Controlled.  Stable.  Currently is just diet controlled.  Patient has been given low-cholesterol low triglyceride dietary guidelines again.  Will check lipid panel and if elevated LDL I asked told patient is likely that we will be initiating a statin agent. - Lipid Panel With LDL/HDL Ratio  4. Seasonal allergic rhinitis due to pollen Chronic.  Episodic.  Stable.  Currently in the midst of spring pollen season.  We will refill patient's Flonase for current exacerbation. - fluticasone (FLONASE) 50 MCG/ACT nasal spray; Place 2 sprays into both nostrils daily.  Dispense: 16 g; Refill: 6      Elizabeth Sauer, MD

## 2024-02-16 LAB — RENAL FUNCTION PANEL
Albumin: 4.6 g/dL (ref 3.8–4.9)
BUN/Creatinine Ratio: 19 (ref 9–23)
BUN: 19 mg/dL (ref 6–24)
CO2: 23 mmol/L (ref 20–29)
Calcium: 10 mg/dL (ref 8.7–10.2)
Chloride: 101 mmol/L (ref 96–106)
Creatinine, Ser: 1.02 mg/dL — ABNORMAL HIGH (ref 0.57–1.00)
Glucose: 142 mg/dL — ABNORMAL HIGH (ref 70–99)
Phosphorus: 3.2 mg/dL (ref 3.0–4.3)
Potassium: 4.3 mmol/L (ref 3.5–5.2)
Sodium: 140 mmol/L (ref 134–144)
eGFR: 64 mL/min/{1.73_m2} (ref >59)

## 2024-02-16 LAB — LIPID PANEL WITH LDL/HDL RATIO
Cholesterol, Total: 218 mg/dL — ABNORMAL HIGH (ref 100–199)
HDL: 69 mg/dL (ref >39)
LDL Chol Calc (NIH): 125 mg/dL — ABNORMAL HIGH (ref 0–99)
LDL/HDL Ratio: 1.8 ratio (ref 0.0–3.2)
Triglycerides: 140 mg/dL (ref 0–149)
VLDL Cholesterol Cal: 24 mg/dL (ref 5–40)

## 2024-02-16 LAB — HEMOGLOBIN A1C
Est. average glucose Bld gHb Est-mCnc: 183 mg/dL
Hgb A1c MFr Bld: 8 % — ABNORMAL HIGH (ref 4.8–5.6)

## 2024-02-17 ENCOUNTER — Telehealth: Payer: Self-pay

## 2024-02-17 NOTE — Telephone Encounter (Signed)
-----   Message from Elizabeth Sauer sent at 02/16/2024  6:30 AM EDT ----- Diabetic management is not in control range with elevated fasting glucose of 142 and A1c of 8.0.  Make sure patient is taking both her metformin and Tradjenta.  We will need to place an earlier endocrine referral either at the [facility] appointment destination phone [phone] which is with the nurse practitioner Ezequiel Ganser [address] [facility] [place].  Most probable will need to get a local endocrine referral [facility] [place].  Call patient and tell her that we are going to see about getting her in with a local endocrinologist because we cannot wait until July for her appointment in [place].

## 2024-02-17 NOTE — Telephone Encounter (Signed)
 Patient aware of results and recommendations.

## 2024-04-01 ENCOUNTER — Other Ambulatory Visit: Payer: Self-pay | Admitting: Family Medicine

## 2024-04-01 DIAGNOSIS — E119 Type 2 diabetes mellitus without complications: Secondary | ICD-10-CM

## 2024-04-01 NOTE — Telephone Encounter (Signed)
 Copied from CRM [id number]. Topic: Clinical - Medication Refill >> Apr 01, 2024 11:19 AM Cynthia K wrote: Medication: metFORMIN  (GLUCOPHAGE -XR) 750 MG 24 hr tablet - Her insurance will only pay for a 90 day supply. Please change order to a 90 day supply  Has the patient contacted their pharmacy? Yes (Agent: If no, request that the patient contact the pharmacy for the refill. If patient does not wish to contact the pharmacy document the reason why and proceed with request.) (Agent: If yes, when and what did the pharmacy advise?) Pharmacy needs order for a 90 day supply before they can refill  This is the patient's preferred pharmacy:  [id number] [place], [place] - [address] AT [facility] [address] [place] [address] Phone: [phone] Fax: [phone]  Is this the correct pharmacy for this prescription? Yes If no, delete pharmacy and type the correct one.   Has the prescription been filled recently? No  Is the patient out of the medication? No - She has enough to last 1 week  Has the patient been seen for an appointment in the last year OR does the patient have an upcoming appointment? Yes  Can we respond through MyChart? Yes  Agent: Please be advised that Rx refills may take up to 3 business days. We ask that you follow-up with your pharmacy.

## 2024-04-04 NOTE — Telephone Encounter (Signed)
 Requested medications are due for refill today.  No - pt needs 90 day supply for insurance to cover  Requested medications are on the active medications list.  yes  Last refill. 02/15/2024 #60 1 rf  Future visit scheduled.   no  Notes to clinic.  Pharmacy requesting 90 day supply  - insurance will not cover otherwise.    Requested Prescriptions  Pending Prescriptions Disp Refills   metFORMIN  (GLUCOPHAGE -XR) 750 MG 24 hr tablet 60 tablet 1    Sig: Take 1 tablet (750 mg total) by mouth daily with breakfast.     Endocrinology:  Diabetes - Biguanides Failed - 04/04/2024  3:41 PM      Failed - Cr in normal range and within 360 days    Creatinine, Ser  Date Value Ref Range Status  02/15/2024 1.02 (H) 0.57 - 1.00 mg/dL Final         Failed - HBA1C is between 0 and 7.9 and within 180 days    Hemoglobin A1C  Date Value Ref Range Status  07/11/2021 13.4  Final   Hgb A1c MFr Bld  Date Value Ref Range Status  02/15/2024 8.0 (H) 4.8 - 5.6 % Final    Comment:             Prediabetes: 5.7 - 6.4          Diabetes: >6.4          Glycemic control for adults with diabetes: <7.0          Failed - B12 Level in normal range and within 720 days    No results found for: "VITAMINB12"       Failed - CBC within normal limits and completed in the last 12 months    WBC  Date Value Ref Range Status  07/27/2017 9.1 3.4 - 10.8 x10E3/uL Final   RBC  Date Value Ref Range Status  07/27/2017 5.30 (H) 3.77 - 5.28 x10E6/uL Final   Hemoglobin  Date Value Ref Range Status  07/27/2017 14.0 11.1 - 15.9 g/dL Final   Hematocrit  Date Value Ref Range Status  07/27/2017 42.4 34.0 - 46.6 % Final   MCHC  Date Value Ref Range Status  07/27/2017 33.0 31.5 - 35.7 g/dL Final   [facility]  Date Value Ref Range Status  07/27/2017 26.4 (L) 26.6 - 33.0 pg Final   MCV  Date Value Ref Range Status  07/27/2017 80 79 - 97 fL Final   No results found for: "PLTCOUNTKUC", "LABPLAT", "POCPLA" RDW  Date Value Ref  Range Status  07/27/2017 14.8 12.3 - 15.4 % Final         Passed - eGFR in normal range and within 360 days    GFR calc Af Amer  Date Value Ref Range Status  08/28/2017 72 >59 mL/min/1.73 Final   GFR calc non Af Amer  Date Value Ref Range Status  08/28/2017 62 >59 mL/min/1.73 Final   eGFR  Date Value Ref Range Status  02/15/2024 64 >59 mL/min/1.73 Final         Passed - Valid encounter within last 6 months    Recent Outpatient Visits           1 month ago Diabetes mellitus treated with oral medication (HCC)   [facility] Kayla Part, MD   3 months ago Diabetes mellitus treated with oral medication [facility])   [facility] at [facility],  Deanna C, MD

## 2024-04-05 MED ORDER — METFORMIN HCL ER 750 MG PO TB24: 750.0000 mg | ORAL_TABLET | Freq: Every day | ORAL | 1 refills | Status: AC
